# Patient Record
Sex: Female | Born: 1965 | Race: White | Hispanic: No | Marital: Married | Smoking: Current every day smoker
Health system: Southern US, Community
[De-identification: ages and names within clinical notes are randomized; demographics above are authoritative.]

## PROBLEM LIST (undated history)

## (undated) DIAGNOSIS — I10 Essential (primary) hypertension: Secondary | ICD-10-CM

## (undated) DIAGNOSIS — F32A Depression, unspecified: Secondary | ICD-10-CM

## (undated) DIAGNOSIS — E119 Type 2 diabetes mellitus without complications: Secondary | ICD-10-CM

## (undated) DIAGNOSIS — F419 Anxiety disorder, unspecified: Secondary | ICD-10-CM

## (undated) HISTORY — PX: NO PAST SURGERIES: SHX2092

---

## 2004-07-31 ENCOUNTER — Emergency Department: Payer: Self-pay | Admitting: Unknown Physician Specialty

## 2004-08-05 ENCOUNTER — Emergency Department: Payer: Self-pay | Admitting: Emergency Medicine

## 2005-02-16 ENCOUNTER — Emergency Department: Payer: Self-pay | Admitting: Emergency Medicine

## 2005-11-17 ENCOUNTER — Emergency Department: Payer: Self-pay | Admitting: Unknown Physician Specialty

## 2006-02-08 ENCOUNTER — Emergency Department: Payer: Self-pay | Admitting: Internal Medicine

## 2006-11-11 ENCOUNTER — Emergency Department: Payer: Self-pay | Admitting: Emergency Medicine

## 2008-07-06 ENCOUNTER — Emergency Department: Payer: Self-pay | Admitting: Emergency Medicine

## 2009-07-26 DIAGNOSIS — I10 Essential (primary) hypertension: Secondary | ICD-10-CM | POA: Insufficient documentation

## 2012-03-24 ENCOUNTER — Ambulatory Visit: Payer: Self-pay | Admitting: Family Medicine

## 2013-12-27 ENCOUNTER — Emergency Department: Payer: Self-pay | Admitting: Emergency Medicine

## 2013-12-27 LAB — CBC
HCT: 43.4 % (ref 35.0–47.0)
HGB: 14.5 g/dL (ref 12.0–16.0)
MCH: 31.2 pg (ref 26.0–34.0)
MCHC: 33.4 g/dL (ref 32.0–36.0)
MCV: 93 fL (ref 80–100)
Platelet: 315 10*3/uL (ref 150–440)
RBC: 4.64 10*6/uL (ref 3.80–5.20)
RDW: 13.3 % (ref 11.5–14.5)
WBC: 12.1 10*3/uL — AB (ref 3.6–11.0)

## 2013-12-27 LAB — BASIC METABOLIC PANEL
Anion Gap: 7 (ref 7–16)
BUN: 6 mg/dL — ABNORMAL LOW (ref 7–18)
CREATININE: 0.61 mg/dL (ref 0.60–1.30)
Calcium, Total: 8.5 mg/dL (ref 8.5–10.1)
Chloride: 111 mmol/L — ABNORMAL HIGH (ref 98–107)
Co2: 23 mmol/L (ref 21–32)
EGFR (Non-African Amer.): >60
GLUCOSE: 97 mg/dL (ref 65–99)
Osmolality: 279 (ref 275–301)
Potassium: 3.5 mmol/L (ref 3.5–5.1)
SODIUM: 141 mmol/L (ref 136–145)

## 2013-12-27 LAB — TROPONIN I: Troponin-I: <0.02 ng/mL

## 2014-02-01 ENCOUNTER — Ambulatory Visit: Payer: Self-pay | Admitting: Cardiovascular Disease

## 2014-08-07 ENCOUNTER — Encounter: Payer: Self-pay | Admitting: Emergency Medicine

## 2014-08-07 ENCOUNTER — Emergency Department
Admission: EM | Admit: 2014-08-07 | Discharge: 2014-08-07 | Disposition: A | Discharge status: Home / Self Care | Payer: BLUE CROSS/BLUE SHIELD | Attending: Student | Admitting: Student

## 2014-08-07 DIAGNOSIS — F419 Anxiety disorder, unspecified: Secondary | ICD-10-CM | POA: Diagnosis not present

## 2014-08-07 DIAGNOSIS — Y998 Other external cause status: Secondary | ICD-10-CM | POA: Insufficient documentation

## 2014-08-07 DIAGNOSIS — Z720 Tobacco use: Secondary | ICD-10-CM | POA: Insufficient documentation

## 2014-08-07 DIAGNOSIS — M549 Dorsalgia, unspecified: Secondary | ICD-10-CM | POA: Diagnosis present

## 2014-08-07 DIAGNOSIS — Y9289 Other specified places as the place of occurrence of the external cause: Secondary | ICD-10-CM | POA: Diagnosis not present

## 2014-08-07 DIAGNOSIS — S39012A Strain of muscle, fascia and tendon of lower back, initial encounter: Secondary | ICD-10-CM | POA: Diagnosis not present

## 2014-08-07 DIAGNOSIS — Y9389 Activity, other specified: Secondary | ICD-10-CM | POA: Insufficient documentation

## 2014-08-07 DIAGNOSIS — X58XXXA Exposure to other specified factors, initial encounter: Secondary | ICD-10-CM | POA: Diagnosis not present

## 2014-08-07 HISTORY — DX: Anxiety disorder, unspecified: F41.9

## 2014-08-07 LAB — URINALYSIS COMPLETE WITH MICROSCOPIC (ARMC ONLY)
BACTERIA UA: NONE SEEN
BILIRUBIN URINE: NEGATIVE
Glucose, UA: NEGATIVE mg/dL
Hgb urine dipstick: NEGATIVE
KETONES UR: NEGATIVE mg/dL
Leukocytes, UA: NEGATIVE
Nitrite: NEGATIVE
PROTEIN: NEGATIVE mg/dL
RBC / HPF: NONE SEEN RBC/hpf (ref 0–5)
SPECIFIC GRAVITY, URINE: 1.001 — AB (ref 1.005–1.030)
WBC, UA: NONE SEEN WBC/hpf (ref 0–5)
pH: 7 (ref 5.0–8.0)

## 2014-08-07 MED ORDER — NAPROXEN 500 MG PO TABS
500.0000 mg | ORAL_TABLET | Freq: Once | ORAL | Status: AC
  Administered 2014-08-07: 500 mg via ORAL

## 2014-08-07 MED ORDER — NAPROXEN 500 MG PO TABS: 500.0000 mg | ORAL_TABLET | Freq: Two times a day (BID) | ORAL | Status: AC

## 2014-08-07 NOTE — Discharge Instructions (Signed)
Back Pain, Adult Low back pain is very common. About 1 in 5 people have back pain.The cause of low back pain is rarely dangerous. The pain often gets better over time.About half of people with a sudden onset of back pain feel better in just 2 weeks. About 8 in 10 people feel better by 6 weeks.  CAUSES Some common causes of back pain include:  Strain of the muscles or ligaments supporting the spine.  Wear and tear (degeneration) of the spinal discs.  Arthritis.  Direct injury to the back. DIAGNOSIS Most of the time, the direct cause of low back pain is not known.However, back pain can be treated effectively even when the exact cause of the pain is unknown.Answering your caregiver's questions about your overall health and symptoms is one of the most accurate ways to make sure the cause of your pain is not dangerous. If your caregiver needs more information, he or she may order lab work or imaging tests (X-rays or MRIs).However, even if imaging tests show changes in your back, this usually does not require surgery. HOME CARE INSTRUCTIONS For many people, back pain returns.Since low back pain is rarely dangerous, it is often a condition that people can learn to [facility] their own.   Remain active. It is stressful on the back to sit or stand in one place. Do not sit, drive, or stand in one place for more than 30 minutes at a time. Take short walks on level surfaces as soon as pain allows.Try to increase the length of time you walk each day.  Do not stay in bed.Resting more than 1 or 2 days can delay your recovery.  Do not avoid exercise or work.Your body is made to move.It is not dangerous to be active, even though your back may hurt.Your back will likely heal faster if you return to being active before your pain is gone.  Pay attention to your body when you bend and lift. Many people have less discomfortwhen lifting if they bend their knees, keep the load close to their bodies,and  avoid twisting. Often, the most comfortable positions are those that put less stress on your recovering back.  Find a comfortable position to sleep. Use a firm mattress and lie on your side with your knees slightly bent. If you lie on your back, put a pillow under your knees.  Only take over-the-counter or prescription medicines as directed by your caregiver. Over-the-counter medicines to reduce pain and inflammation are often the most helpful.Your caregiver may prescribe muscle relaxant drugs.These medicines help dull your pain so you can more quickly return to your normal activities and healthy exercise.  Put ice on the injured area.  Put ice in a plastic bag.  Place a towel between your skin and the bag.  Leave the ice on for 15-20 minutes, 03-04 times a day for the first 2 to 3 days. After that, ice and heat may be alternated to reduce pain and spasms.  Ask your caregiver about trying back exercises and gentle massage. This may be of some benefit.  Avoid feeling anxious or stressed.Stress increases muscle tension and can worsen back pain.It is important to recognize when you are anxious or stressed and learn ways to manage it.Exercise is a great option. SEEK MEDICAL CARE IF:  You have pain that is not relieved with rest or medicine.  You have pain that does not improve in 1 week.  You have new symptoms.  You are generally not feeling well. SEEK  IMMEDIATE MEDICAL CARE IF:  °· You have pain that radiates from your back into your legs. °· You develop new bowel or bladder control problems. °· You have unusual weakness or numbness in your arms or legs. °· You develop nausea or vomiting. °· You develop abdominal pain. °· You feel faint. °Document Released: 03/25/2005 Document Revised: 09/24/2011 Document Reviewed: 07/27/2013 °[employer]® Patient Information ©2015 [employer]. This information is not intended to replace advice given to you by your health care provider. Make sure you  discuss any questions you have with your health care provider. ° °Back Exercises °Back exercises help treat and prevent back injuries. The goal is to increase your strength in your belly (abdominal) and back muscles. These exercises can also help with flexibility. Start these exercises when told by your doctor. °HOME CARE °Back exercises include: °Pelvic Tilt. °· Lie on your back with your knees bent. Tilt your pelvis until the lower part of your back is against the floor. Hold this position 5 to 10 sec. Repeat this exercise 5 to 10 times. °Knee to Chest. °· Pull 1 knee up against your chest and hold for 20 to 30 seconds. Repeat this with the other knee. This may be done with the other leg straight or bent, whichever feels better. Then, pull both knees up against your chest. °Sit-Ups or Curl-Ups. °· Bend your knees 90 degrees. Start with tilting your pelvis, and do a partial, slow sit-up. Only lift your upper half 30 to 45 degrees off the floor. Take at least 2 to 3 seonds for each sit-up. Do not do sit-ups with your knees out straight. If partial sit-ups are difficult, simply do the above but with only tightening your belly (abdominal) muscles and holding it as told. °Hip-Lift. °· Lie on your back with your knees flexed 90 degrees. Push down with your feet and shoulders as you raise your hips 2 inches off the floor. Hold for 10 seconds, repeat 5 to 10 times. °Back Arches. °· Lie on your stomach. Prop yourself up on bent elbows. Slowly press on your hands, causing an arch in your low back. Repeat 3 to 5 times. °Shoulder-Lifts. °· Lie face down with arms beside your body. Keep hips and belly pressed to floor as you slowly lift your head and shoulders off the floor. °Do not overdo your exercises. Be careful in the beginning. Exercises may cause you some mild back discomfort. If the pain lasts for more than 15 minutes, stop the exercises until you see your doctor. Improvement with exercise for back problems is slow.    °Document Released: 04/27/2010 Document Revised: 06/17/2011 Document Reviewed: 01/24/2011 °[employer]® Patient Information ©2015 [employer]. This information is not intended to replace advice given to you by your health care provider. Make sure you discuss any questions you have with your health care provider. ° °

## 2014-08-07 NOTE — ED Notes (Signed)
Patient c/o left flank pain. Admits to urinary frequency. Denies any dysuria, hematuria. Denies any knowledge of any precipitating injury. Able to ambulate to triage. No obvious distress.

## 2014-08-07 NOTE — ED Provider Notes (Addendum)
[facility] Emergency Department Provider Note    ____________________________________________  Time seen: ----------------------------------------- 5:09 PM on 08/07/2014 -----------------------------------------    I have reviewed the triage vital signs and the nursing notes.   HISTORY  Chief Complaint Back Pain       HPI Linda Horn is a 49 y.o. female with history of anxiety who presents with one-day atraumatic left leg pain. Gradual onset. Currently maximal severity. She reports cramping pain on the left mid back. Pain is worse with movement, rotation. No recent surgery, no new meds, no injury, no falls, no trauma. Staying still makes it better. He denies any history of malignancy, bowel or bladder incontinence, numbness or weakness, fevers or history of IV drug use. She has had increased urination, but no dysuria, no hematuria, no history of kidney stones.     Past Medical History  Diagnosis Date  . Anxiety     There are no active problems to display for this patient.   History reviewed. No pertinent past surgical history.  Current Outpatient Rx  Name  Route  Sig  Dispense  Refill  . naproxen (NAPROSYN) 500 MG tablet   Oral   Take 1 tablet (500 mg total) by mouth 2 (two) times daily with a meal.   10 tablet   0     Allergies Review of patient's allergies indicates no known allergies.  No family history on file.  Social History History  Substance Use Topics  . Smoking status: Current Every Day Smoker -- 1.00 packs/day for 25 years    Types: Cigarettes  . Smokeless tobacco: Not on file  . Alcohol Use: No    Review of Systems  Constitutional: Negative for fever. Eyes: Negative for visual changes. ENT: Negative for sore throat. Cardiovascular: Negative for chest pain. Respiratory: Negative for shortness of breath. Gastrointestinal: Negative for abdominal pain, vomiting and diarrhea. Genitourinary: Negative for  dysuria. Musculoskeletal: Positive for back pain. Skin: Negative for rash. Neurological: Negative for headaches, focal weakness or numbness.   10-point ROS otherwise negative.  ____________________________________________   PHYSICAL EXAM:  VITAL SIGNS: ED Triage Vitals  Enc Vitals Group     BP 08/07/14 1502 148/68 mmHg     Pulse Rate 08/07/14 1502 67     Resp 08/07/14 1502 16     Temp 08/07/14 1502 98.2 F (36.8 C)     Temp Source 08/07/14 1502 Oral     SpO2 08/07/14 1502 97 %     Weight 08/07/14 1502 164 lb (74.39 kg)     Height 08/07/14 1502 5\' 1"  (1.549 m)     Head Cir --      Peak Flow --      Pain Score 08/07/14 1504 10     Pain Loc --      Pain Edu? --      Excl. in [place]? --      Constitutional: Alert and oriented. Well appearing and in no distress. Eyes: Conjunctivae are normal. PERRL. Normal extraocular movements. ENT   Head: Normocephalic and atraumatic.   Nose: No congestion/rhinnorhea.   Mouth/Throat: Mucous membranes are moist.   Neck: No stridor. Hematological/Lymphatic/Immunilogical: No cervical lymphadenopathy. Cardiovascular: Normal rate, regular rhythm. Normal and symmetric distal pulses are present in all extremities. No murmurs, rubs, or gallops. Respiratory: Normal respiratory effort without tachypnea nor retractions. Breath sounds are clear and equal bilaterally. No wheezes/rales/rhonchi. Gastrointestinal: Soft and nontender. No distention. No abdominal bruits. There is no CVA tenderness. Genitourinary: deferred Musculoskeletal: Nontender  with normal range of motion in all extremities. No joint effusions.  No lower extremity tenderness nor edema. Back: Moderate tenderness of the left paravertebral muscles of the mid T-spine no midline tenderness. No bony step-off or deformity. Neurologic:  Normal speech and language. No gross focal neurologic deficits are appreciated. Speech is normal. No gait instability. Normal dorsiflexion of bilateral  big toes. Primary 5 strength in bilateral lower extremities Skin:  Skin is warm, dry and intact. No rash noted. Psychiatric: Mood and affect are normal. Speech and behavior are normal. Patient exhibits appropriate insight and judgment.  ____________________________________________    ____________________________________________    RADIOLOGY  None ____________________________________________   PROCEDURES   Procedure(s) performed: None  Critical Care performed: No  ____________________________________________   INITIAL IMPRESSION / ASSESSMENT AND PLAN / ED COURSE  Pertinent labs & imaging results that were available during my care of the patient were reviewed by me and considered in my medical decision making (see chart for details).  49 year old female presents with 1 day of atraumatic left back pain, likely muscular skeletal in nature, reproducible on exam. Advised concerning for cauda equina or epidural abscess. Intact neurological examination. No indication for imaging in the absence of trauma or bony tenderness. Discussed symptomatic care, treatment with anti-inflammatory medications, PCP follow-up. Patient is in agreement with discharge plan. UA negative. No  Pregnancy test that she is post menopausal ____________________________________________   FINAL CLINICAL IMPRESSION(S) / ED DIAGNOSES  Final diagnoses:  Back strain, initial encounter     Joanne Gavel, MD 08/07/14 [place]  Joanne Gavel, MD 08/07/14 1719

## 2015-08-18 ENCOUNTER — Emergency Department: Payer: BLUE CROSS/BLUE SHIELD

## 2015-08-18 ENCOUNTER — Encounter: Payer: Self-pay | Admitting: Medical Oncology

## 2015-08-18 ENCOUNTER — Emergency Department
Admission: EM | Admit: 2015-08-18 | Discharge: 2015-08-18 | Disposition: A | Discharge status: Home / Self Care | Payer: BLUE CROSS/BLUE SHIELD | Attending: Emergency Medicine | Admitting: Emergency Medicine

## 2015-08-18 DIAGNOSIS — I10 Essential (primary) hypertension: Secondary | ICD-10-CM | POA: Insufficient documentation

## 2015-08-18 DIAGNOSIS — Z79899 Other long term (current) drug therapy: Secondary | ICD-10-CM | POA: Diagnosis not present

## 2015-08-18 DIAGNOSIS — M545 Low back pain, unspecified: Secondary | ICD-10-CM

## 2015-08-18 DIAGNOSIS — R739 Hyperglycemia, unspecified: Secondary | ICD-10-CM | POA: Diagnosis not present

## 2015-08-18 DIAGNOSIS — F17210 Nicotine dependence, cigarettes, uncomplicated: Secondary | ICD-10-CM | POA: Diagnosis not present

## 2015-08-18 HISTORY — DX: Essential (primary) hypertension: I10

## 2015-08-18 LAB — BASIC METABOLIC PANEL
Anion gap: 5 (ref 5–15)
BUN: 10 mg/dL (ref 6–20)
CALCIUM: 9.1 mg/dL (ref 8.9–10.3)
CO2: 27 mmol/L (ref 22–32)
CREATININE: 0.53 mg/dL (ref 0.44–1.00)
Chloride: 108 mmol/L (ref 101–111)
Glucose, Bld: 148 mg/dL — ABNORMAL HIGH (ref 65–99)
POTASSIUM: 3.8 mmol/L (ref 3.5–5.1)
Sodium: 140 mmol/L (ref 135–145)

## 2015-08-18 LAB — CBC WITH DIFFERENTIAL/PLATELET
BASOS ABS: 0.1 10*3/uL (ref 0–0.1)
EOS ABS: 0.2 10*3/uL (ref 0–0.7)
HCT: 42.6 % (ref 35.0–47.0)
Hemoglobin: 14.4 g/dL (ref 12.0–16.0)
Lymphocytes Relative: 32 %
Lymphs Abs: 3.6 10*3/uL (ref 1.0–3.6)
MCH: 30.9 pg (ref 26.0–34.0)
MCHC: 33.8 g/dL (ref 32.0–36.0)
MCV: 91.2 fL (ref 80.0–100.0)
MONO ABS: 0.7 10*3/uL (ref 0.2–0.9)
Monocytes Relative: 6 %
Neutro Abs: 6.8 10*3/uL — ABNORMAL HIGH (ref 1.4–6.5)
Neutrophils Relative %: 59 %
PLATELETS: 287 10*3/uL (ref 150–440)
RBC: 4.67 MIL/uL (ref 3.80–5.20)
RDW: 13.6 % (ref 11.5–14.5)
WBC: 11.5 10*3/uL — AB (ref 3.6–11.0)

## 2015-08-18 LAB — URINALYSIS COMPLETE WITH MICROSCOPIC (ARMC ONLY)
BILIRUBIN URINE: NEGATIVE
KETONES UR: NEGATIVE mg/dL
Leukocytes, UA: NEGATIVE
NITRITE: NEGATIVE
Protein, ur: NEGATIVE mg/dL
Specific Gravity, Urine: 1.022 (ref 1.005–1.030)
pH: 5 (ref 5.0–8.0)

## 2015-08-18 LAB — PREGNANCY, URINE: Preg Test, Ur: NEGATIVE

## 2015-08-18 LAB — HEMOGLOBIN A1C: HEMOGLOBIN A1C: 7.7 % — AB (ref 4.0–6.0)

## 2015-08-18 MED ORDER — KETOROLAC TROMETHAMINE 30 MG/ML IJ SOLN
30.0000 mg | Freq: Once | INTRAMUSCULAR | Status: AC
  Administered 2015-08-18: 30 mg via INTRAMUSCULAR
  Filled 2015-08-18: qty 1

## 2015-08-18 MED ORDER — CYCLOBENZAPRINE HCL 5 MG PO TABS: 5.0000 mg | ORAL_TABLET | Freq: Three times a day (TID) | ORAL | Status: DC | PRN

## 2015-08-18 MED ORDER — NAPROXEN 500 MG PO TABS: 500.0000 mg | ORAL_TABLET | Freq: Two times a day (BID) | ORAL | Status: DC

## 2015-08-18 NOTE — Discharge Instructions (Signed)
Back Exercises The following exercises strengthen the muscles that help to support the back. They also help to keep the lower back flexible. Doing these exercises can help to prevent back pain or lessen existing pain. If you have back pain or discomfort, try doing these exercises 2-3 times each day or as told by your health care provider. When the pain goes away, do them once each day, but increase the number of times that you repeat the steps for each exercise (do more repetitions). If you do not have back pain or discomfort, do these exercises once each day or as told by your health care provider. EXERCISES Single Knee to Chest Repeat these steps 3-5 times for each leg:  Lie on your back on a firm bed or the floor with your legs extended.  Bring one knee to your chest. Your other leg should stay extended and in contact with the floor.  Hold your knee in place by grabbing your knee or thigh.  Pull on your knee until you feel a gentle stretch in your lower back.  Hold the stretch for 10-30 seconds.  Slowly release and straighten your leg. Pelvic Tilt Repeat these steps 5-10 times:  Lie on your back on a firm bed or the floor with your legs extended.  Bend your knees so they are pointing toward the ceiling and your feet are flat on the floor.  Tighten your lower abdominal muscles to press your lower back against the floor. This motion will tilt your pelvis so your tailbone points up toward the ceiling instead of pointing to your feet or the floor.  With gentle tension and even breathing, hold this position for 5-10 seconds. Cat-Cow Repeat these steps until your lower back becomes more flexible:  Get into a hands-and-knees position on a firm surface. Keep your hands under your shoulders, and keep your knees under your hips. You may place padding under your knees for comfort.  Let your head hang down, and point your tailbone toward the floor so your lower back becomes rounded like the  back of a cat.  Hold this position for 5 seconds.  Slowly lift your head and point your tailbone up toward the ceiling so your back forms a sagging arch like the back of a cow.  Hold this position for 5 seconds. Press-Ups Repeat these steps 5-10 times:  Lie on your abdomen (face-down) on the floor.  Place your palms near your head, about shoulder-width apart.  While you keep your back as relaxed as possible and keep your hips on the floor, slowly straighten your arms to raise the top half of your body and lift your shoulders. Do not use your back muscles to raise your upper torso. You may adjust the placement of your hands to make yourself more comfortable.  Hold this position for 5 seconds while you keep your back relaxed.  Slowly return to lying flat on the floor. Bridges Repeat these steps 10 times:  Lie on your back on a firm surface.  Bend your knees so they are pointing toward the ceiling and your feet are flat on the floor.  Tighten your buttocks muscles and lift your buttocks off of the floor until your waist is at almost the same height as your knees. You should feel the muscles working in your buttocks and the back of your thighs. If you do not feel these muscles, slide your feet 1-2 inches farther away from your buttocks.  Hold this position for 3-5  seconds.  Slowly lower your hips to the starting position, and allow your buttocks muscles to relax completely. If this exercise is too easy, try doing it with your arms crossed over your chest. Abdominal Crunches Repeat these steps 5-10 times:  Lie on your back on a firm bed or the floor with your legs extended.  Bend your knees so they are pointing toward the ceiling and your feet are flat on the floor.  Cross your arms over your chest.  Tip your chin slightly toward your chest without bending your neck.  Tighten your abdominal muscles and slowly raise your trunk (torso) high enough to lift your shoulder blades a  tiny bit off of the floor. Avoid raising your torso higher than that, because it can put too much stress on your low back and it does not help to strengthen your abdominal muscles.  Slowly return to your starting position. Back Lifts Repeat these steps 5-10 times:  Lie on your abdomen (face-down) with your arms at your sides, and rest your forehead on the floor.  Tighten the muscles in your legs and your buttocks.  Slowly lift your chest off of the floor while you keep your hips pressed to the floor. Keep the back of your head in line with the curve in your back. Your eyes should be looking at the floor.  Hold this position for 3-5 seconds.  Slowly return to your starting position. SEEK MEDICAL CARE IF:  Your back pain or discomfort gets much worse when you do an exercise.  Your back pain or discomfort does not lessen within 2 hours after you exercise. If you have any of these problems, stop doing these exercises right away. Do not do them again unless your health care provider says that you can. SEEK IMMEDIATE MEDICAL CARE IF:  You develop sudden, severe back pain. If this happens, stop doing the exercises right away. Do not do them again unless your health care provider says that you can.   This information is not intended to replace advice given to you by your health care provider. Make sure you discuss any questions you have with your health care provider.   Document Released: 05/02/2004 Document Revised: 12/14/2014 Document Reviewed: 05/19/2014 Elsevier Interactive Patient Education 2016 [employer]  Back Pain, Adult Back pain is very common in adults.The cause of back pain is rarely dangerous and the pain often gets better over time.The cause of your back pain may not be known. Some common causes of back pain include:  Strain of the muscles or ligaments supporting the spine.  Wear and tear (degeneration) of the spinal disks.  Arthritis.  Direct injury to the  back. For many people, back pain may return. Since back pain is rarely dangerous, most people can learn to manage this condition on their own. HOME CARE INSTRUCTIONS Watch your back pain for any changes. The following actions may help to lessen any discomfort you are feeling:  Remain active. It is stressful on your back to sit or stand in one place for long periods of time. Do not sit, drive, or stand in one place for more than 30 minutes at a time. Take short walks on even surfaces as soon as you are able.Try to increase the length of time you walk each day.  Exercise regularly as directed by your health care provider. Exercise helps your back heal faster. It also helps avoid future injury by keeping your muscles strong and flexible.  Do not stay in  bed.Resting more than 1-2 days can delay your recovery.  Pay attention to your body when you bend and lift. The most comfortable positions are those that put less stress on your recovering back. Always use proper lifting techniques, including:  Bending your knees.  Keeping the load close to your body.  Avoiding twisting.  Find a comfortable position to sleep. Use a firm mattress and lie on your side with your knees slightly bent. If you lie on your back, put a pillow under your knees.  Avoid feeling anxious or stressed.Stress increases muscle tension and can worsen back pain.It is important to recognize when you are anxious or stressed and learn ways to manage it, such as with exercise.  Take medicines only as directed by your health care provider. Over-the-counter medicines to reduce pain and inflammation are often the most helpful.Your health care provider may prescribe muscle relaxant drugs.These medicines help dull your pain so you can more quickly return to your normal activities and healthy exercise.  Apply ice to the injured area:  Put ice in a plastic bag.  Place a towel between your skin and the bag.  Leave the ice on for 20  minutes, 2-3 times a day for the first 2-3 days. After that, ice and heat may be alternated to reduce pain and spasms.  Maintain a healthy weight. Excess weight puts extra stress on your back and makes it difficult to maintain good posture. SEEK MEDICAL CARE IF:  You have pain that is not relieved with rest or medicine.  You have increasing pain going down into the legs or buttocks.  You have pain that does not improve in one week.  You have night pain.  You lose weight.  You have a fever or chills. SEEK IMMEDIATE MEDICAL CARE IF:   You develop new bowel or bladder control problems.  You have unusual weakness or numbness in your arms or legs.  You develop nausea or vomiting.  You develop abdominal pain.  You feel faint.   This information is not intended to replace advice given to you by your health care provider. Make sure you discuss any questions you have with your health care provider.   Document Released: 03/25/2005 Document Revised: 04/15/2014 Document Reviewed: 07/27/2013 Elsevier Interactive Patient Education 2016 [place].  Hyperglycemia Hyperglycemia occurs when the glucose (sugar) in your blood is too high. Hyperglycemia can happen for many reasons, but it most often happens to people who do not know they have diabetes or are not managing their diabetes properly.  CAUSES  Whether you have diabetes or not, there are other causes of hyperglycemia. Hyperglycemia can occur when you have diabetes, but it can also occur in other situations that you might not be as aware of, such as: Diabetes  If you have diabetes and are having problems controlling your blood glucose, hyperglycemia could occur because of some of the following reasons:  Not following your meal plan.  Not taking your diabetes medications or not taking it properly.  Exercising less or doing less activity than you normally do.  Being sick. Pre-diabetes  This cannot be ignored. Before people  develop Type 2 diabetes, they almost always have "pre-diabetes." This is when your blood glucose levels are higher than normal, but not yet high enough to be diagnosed as diabetes. Research has shown that some long-term damage to the body, especially the heart and circulatory system, may already be occurring during pre-diabetes. If you take action to manage your blood glucose when  you have pre-diabetes, you may delay or prevent Type 2 diabetes from developing. Stress  If you have diabetes, you may be "diet" controlled or on oral medications or insulin to control your diabetes. However, you may find that your blood glucose is higher than usual in the hospital whether you have diabetes or not. This is often referred to as "stress hyperglycemia." Stress can elevate your blood glucose. This happens because of hormones put out by the body during times of stress. If stress has been the cause of your high blood glucose, it can be followed regularly by your caregiver. That way he/she can make sure your hyperglycemia does not continue to get worse or progress to diabetes. Steroids  Steroids are medications that act on the infection fighting system (immune system) to block inflammation or infection. One side effect can be a rise in blood glucose. Most people can produce enough extra insulin to allow for this rise, but for those who cannot, steroids make blood glucose levels go even higher. It is not unusual for steroid treatments to "uncover" diabetes that is developing. It is not always possible to determine if the hyperglycemia will go away after the steroids are stopped. A special blood test called an A1c is sometimes done to determine if your blood glucose was elevated before the steroids were started. SYMPTOMS  Thirsty.  Frequent urination.  Dry mouth.  Blurred vision.  Tired or fatigue.  Weakness.  Sleepy.  Tingling in feet or leg. DIAGNOSIS  Diagnosis is made by monitoring blood glucose in one  or all of the following ways:  A1c test. This is a chemical found in your blood.  Fingerstick blood glucose monitoring.  Laboratory results. TREATMENT  First, knowing the cause of the hyperglycemia is important before the hyperglycemia can be treated. Treatment may include, but is not be limited to:  Education.  Change or adjustment in medications.  Change or adjustment in meal plan.  Treatment for an illness, infection, etc.  More frequent blood glucose monitoring.  Change in exercise plan.  Decreasing or stopping steroids.  Lifestyle changes. HOME CARE INSTRUCTIONS   Test your blood glucose as directed.  Exercise regularly. Your caregiver will give you instructions about exercise. Pre-diabetes or diabetes which comes on with stress is helped by exercising.  Eat wholesome, balanced meals. Eat often and at regular, fixed times. Your caregiver or nutritionist will give you a meal plan to guide your sugar intake.  Being at an ideal weight is important. If needed, losing as little as 10 to 15 pounds may help improve blood glucose levels. SEEK MEDICAL CARE IF:   You have questions about medicine, activity, or diet.  You continue to have symptoms (problems such as increased thirst, urination, or weight gain). SEEK IMMEDIATE MEDICAL CARE IF:   You are vomiting or have diarrhea.  Your breath smells fruity.  You are breathing faster or slower.  You are very sleepy or incoherent.  You have numbness, tingling, or pain in your feet or hands.  You have chest pain.  Your symptoms get worse even though you have been following your caregiver's orders.  If you have any other questions or concerns.   This information is not intended to replace advice given to you by your health care provider. Make sure you discuss any questions you have with your health care provider.   Document Released: 09/18/2000 Document Revised: 06/17/2011 Document Reviewed: 11/29/2014 Elsevier  Interactive Patient Education [employer].

## 2015-08-18 NOTE — ED Notes (Signed)
See triage note  States she developed some lower back pain yesterday unsure of injury  Denies any urinary sx's  Ambulates slowly d/t pain

## 2015-08-18 NOTE — ED Provider Notes (Signed)
[facility] Emergency Department Provider Note  ____________________________________________  Time seen: Approximately 8:48 AM  I have reviewed the triage vital signs and the nursing notes.   HISTORY  Chief Complaint Back Pain    HPI Linda Horn is a 50 y.o. female , NAD, presents to the emergency Department with 24-hour history of right lower back pain. States the pain is in the right lower back only. No radiation of pain.The pain began while she was working. Does note that she lifted a crate of eggs prior to the pain starting but is uncertain if the lifting was related to her onset of pain. Denies any numbness, weakness, tingling. No loss of bowel or bladder control. No saddle paresthesias. Denies any injuries, traumas, falls to incite the back pain. Has taken 2 tablets of over-the-counter ibuprofen with no resolution of pain. Denies any dysuria, hematuria, abdominal pain, nausea, vomiting. Has not noted any chest pain nor shortness of breath.   Past Medical History  Diagnosis Date  . Anxiety   . Hypertension     There are no active problems to display for this patient.   History reviewed. No pertinent past surgical history.  Current Outpatient Rx  Name  Route  Sig  Dispense  Refill  . escitalopram (LEXAPRO) 20 MG tablet   Oral   Take 20 mg by mouth daily.         . hydrochlorothiazide (HYDRODIURIL) 25 MG tablet   Oral   Take 25 mg by mouth daily.         . cyclobenzaprine (FLEXERIL) 5 MG tablet   Oral   Take 1 tablet (5 mg total) by mouth every 8 (eight) hours as needed for muscle spasms.   21 tablet   0   . naproxen (NAPROSYN) 500 MG tablet   Oral   Take 1 tablet (500 mg total) by mouth 2 (two) times daily with a meal.   14 tablet   0     Allergies Review of patient's allergies indicates no known allergies.  No family history on file.  Social History Social History  Substance Use Topics  . Smoking status: Current  Every Day Smoker -- 1.00 packs/day for 25 years    Types: Cigarettes  . Smokeless tobacco: None  . Alcohol Use: No     Review of Systems  Constitutional: No fever/chills Cardiovascular: No chest pain. Respiratory:  No shortness of breath.  Gastrointestinal: No abdominal pain.  No nausea, vomiting.   Genitourinary: Negative for dysuria, hematuria. No urinary hesitancy, urgency or increased frequency. Musculoskeletal: Positive for right lower back pain. Negative for neck pain.  Skin: Negative for rash, skin sores, bruising. Neurological: Negative for headaches, focal weakness or numbness. No tingling. No saddle paresthesias. No loss of bowel or bladder control. 10-point ROS otherwise negative.  ____________________________________________   PHYSICAL EXAM:  VITAL SIGNS: ED Triage Vitals  Enc Vitals Group     BP 08/18/15 0831 144/65 mmHg     Pulse Rate 08/18/15 0831 65     Resp 08/18/15 0831 17     Temp 08/18/15 0831 97.7 F (36.5 C)     Temp Source 08/18/15 0831 Oral     SpO2 08/18/15 0831 97 %     Weight 08/18/15 0831 165 lb (74.844 kg)     Height 08/18/15 0831 5\' 1"  (1.549 m)     Head Cir --      Peak Flow --      Pain Score 08/18/15 0832  10     Pain Loc --      Pain Edu? --      Excl. in [place]? --      Constitutional: Alert and oriented. Well appearing and in no acute distress. Eyes: Conjunctivae are normal.  Head: Atraumatic. Neck: No cervical spine tenderness to palpation. Supple with full range of motion. Respiratory: Normal respiratory effort without tachypnea or retractions.  Gastrointestinal:  No CVA tenderness. Musculoskeletal: Full range of motion of the lumbar spine with mild discomfort with full flexion and left lateral flexion. No thoracic, lumbar, sacral spinal tenderness to palpation. No paraspinal tenderness about the lumbar spine. No palpation of muscle spasms noted.   Neurologic:  Normal speech and language. No gross focal neurologic deficits are  appreciated.  Skin:  Skin is warm, dry and intact. No rash, skin sores, redness noted. Psychiatric: Mood and affect are normal. Speech and behavior are normal. Patient exhibits appropriate insight and judgement.   ____________________________________________   LABS (all labs ordered are listed, but only abnormal results are displayed)  Labs Reviewed  URINALYSIS COMPLETEWITH MICROSCOPIC ([place] ONLY) - Abnormal; Notable for the following:    Color, Urine YELLOW (*)    APPearance HAZY (*)    Glucose, UA >500 (*)    Hgb urine dipstick 1+ (*)    Bacteria, UA RARE (*)    Squamous Epithelial / LPF 6-30 (*)    All other components within normal limits  BASIC METABOLIC PANEL - Abnormal; Notable for the following:    Glucose, Bld 148 (*)    All other components within normal limits  CBC WITH DIFFERENTIAL/PLATELET - Abnormal; Notable for the following:    WBC 11.5 (*)    Neutro Abs 6.8 (*)    All other components within normal limits  URINE CULTURE  PREGNANCY, URINE  HEMOGLOBIN A1C   ____________________________________________  EKG  None ____________________________________________  RADIOLOGY I have personally viewed and evaluated these images (plain radiographs) as part of my medical decision making, as well as reviewing the written report by the radiologist.  Ct Renal Stone Study  08/18/2015  CLINICAL DATA:  Right low back pain since yesterday. No known injury. Initial encounter. EXAM: CT ABDOMEN AND PELVIS WITHOUT CONTRAST TECHNIQUE: Multidetector CT imaging of the abdomen and pelvis was performed following the standard protocol without IV contrast. COMPARISON:  None. FINDINGS: The lung bases demonstrate mild dependent atelectasis. No pleural or pericardial effusion. No renal or ureteral stones are identified on the right or left. The kidneys, ureters and urinary bladder are unremarkable. Uterus and adnexa appear normal. The liver is diffusely low attenuating consistent with fatty  infiltration. The liver measures approximately 21 cm craniocaudal. The gallbladder, adrenal glands, spleen, pancreas and biliary tree appear normal. Aortoiliac atherosclerosis without aneurysm is identified. The stomach, small and large bowel and appendix appear normal. No lymphadenopathy or fluid. No focal bony abnormality. Degenerative disc disease is seen at L5-S1. IMPRESSION: Negative for urinary tract stone or acute abnormality. Fatty infiltration of the liver and hepatomegaly. Aortoiliac atherosclerosis. L5-S1 degenerative disc disease. Electronically Signed   By: Inge Rise M.D.   On: 08/18/2015 10:38    ____________________________________________    PROCEDURES  Procedure(s) performed: None    Medications  ketorolac (TORADOL) 30 MG/ML injection 30 mg (30 mg Intramuscular Given 08/18/15 0924)   Patient has improvement of back pain since being given Toradol earlier this morning. She has been sleeping throughout part of her visit and notes feeling comfortable when she lays down.  ____________________________________________  INITIAL IMPRESSION / ASSESSMENT AND PLAN / ED COURSE  Pertinent labs & imaging results that were available during my care of the patient were reviewed by me and considered in my medical decision making (see chart for details).  All labs and imaging results that were available were discussed with the patient. She does have a significant family history for diabetes in which she has a sibling as well as her mother are diabetic and on glucose lowering medications. Patient does note she has had increased polydipsia and polyuria over the last few months. Has not had any abdominal pain, nausea, vomiting nor overt fatigue.   Patient's diagnosis is consistent with right-sided lower back pain without sciatica and incidental finding of hyperglycemia more than likely related to undiagnosed diabetes mellitus. Patient will be discharged home with prescriptions for  naproxen and Flexeril to take as directed. Patient is to follow up with her primary care provider on Monday for follow-up of hyperglycemia and subsequent A1c results that are currently pending.  Patient is given ED precautions to return to the ED for any worsening or new symptoms.    ____________________________________________  FINAL CLINICAL IMPRESSION(S) / ED DIAGNOSES  Final diagnoses:  Right-sided low back pain without sciatica  Hyperglycemia      NEW MEDICATIONS STARTED DURING THIS VISIT:  New Prescriptions   CYCLOBENZAPRINE (FLEXERIL) 5 MG TABLET    Take 1 tablet (5 mg total) by mouth every 8 (eight) hours as needed for muscle spasms.   NAPROXEN (NAPROSYN) 500 MG TABLET    Take 1 tablet (500 mg total) by mouth 2 (two) times daily with a meal.         Braxton Feathers, PA-C 08/18/15 1228  Daymon Larsen, MD 08/18/15 [phone]

## 2015-08-18 NOTE — ED Notes (Signed)
Pt reports that she began having rt lower back pain yesterday.

## 2015-08-19 ENCOUNTER — Other Ambulatory Visit: Payer: Self-pay | Admitting: Family Medicine

## 2015-08-19 DIAGNOSIS — I10 Essential (primary) hypertension: Secondary | ICD-10-CM

## 2015-08-20 LAB — URINE CULTURE

## 2015-08-26 ENCOUNTER — Other Ambulatory Visit: Payer: Self-pay | Admitting: Family Medicine

## 2015-08-26 DIAGNOSIS — F430 Acute stress reaction: Secondary | ICD-10-CM

## 2015-09-23 ENCOUNTER — Other Ambulatory Visit: Payer: Self-pay | Admitting: Family Medicine

## 2016-09-26 ENCOUNTER — Other Ambulatory Visit: Payer: Self-pay | Admitting: Nurse Practitioner

## 2016-09-26 DIAGNOSIS — R109 Unspecified abdominal pain: Secondary | ICD-10-CM

## 2016-10-07 ENCOUNTER — Ambulatory Visit: Payer: Self-pay

## 2017-01-01 ENCOUNTER — Other Ambulatory Visit: Payer: Self-pay | Admitting: Nurse Practitioner

## 2017-01-01 DIAGNOSIS — Z1231 Encounter for screening mammogram for malignant neoplasm of breast: Secondary | ICD-10-CM

## 2017-02-18 ENCOUNTER — Ambulatory Visit
Admission: RE | Admit: 2017-02-18 | Discharge: 2017-02-18 | Disposition: A | Discharge status: Home / Self Care | Payer: BLUE CROSS/BLUE SHIELD | Source: Ambulatory Visit | Attending: Nurse Practitioner | Admitting: Nurse Practitioner

## 2017-02-18 DIAGNOSIS — Z1231 Encounter for screening mammogram for malignant neoplasm of breast: Secondary | ICD-10-CM | POA: Diagnosis present

## 2017-03-09 ENCOUNTER — Other Ambulatory Visit: Payer: Self-pay

## 2017-03-09 ENCOUNTER — Encounter: Payer: Self-pay | Admitting: Emergency Medicine

## 2017-03-09 ENCOUNTER — Emergency Department
Admission: EM | Admit: 2017-03-09 | Discharge: 2017-03-09 | Disposition: A | Discharge status: Home / Self Care | Payer: BLUE CROSS/BLUE SHIELD | Attending: Emergency Medicine | Admitting: Emergency Medicine

## 2017-03-09 DIAGNOSIS — Z79899 Other long term (current) drug therapy: Secondary | ICD-10-CM | POA: Diagnosis not present

## 2017-03-09 DIAGNOSIS — I10 Essential (primary) hypertension: Secondary | ICD-10-CM | POA: Insufficient documentation

## 2017-03-09 DIAGNOSIS — K047 Periapical abscess without sinus: Secondary | ICD-10-CM

## 2017-03-09 DIAGNOSIS — F17210 Nicotine dependence, cigarettes, uncomplicated: Secondary | ICD-10-CM | POA: Diagnosis not present

## 2017-03-09 DIAGNOSIS — K0889 Other specified disorders of teeth and supporting structures: Secondary | ICD-10-CM | POA: Diagnosis present

## 2017-03-09 MED ORDER — MAGIC MOUTHWASH W/LIDOCAINE: 5.0000 mL | Freq: Four times a day (QID) | ORAL | 0 refills | Status: DC

## 2017-03-09 MED ORDER — LIDOCAINE-EPINEPHRINE 2 %-1:100000 IJ SOLN
1.7000 mL | Freq: Once | INTRAMUSCULAR | Status: AC
  Administered 2017-03-09: 1.7 mL
  Filled 2017-03-09: qty 1.7

## 2017-03-09 MED ORDER — CLINDAMYCIN HCL 150 MG PO CAPS
300.0000 mg | ORAL_CAPSULE | Freq: Once | ORAL | Status: AC
  Administered 2017-03-09: 300 mg via ORAL
  Filled 2017-03-09: qty 2

## 2017-03-09 MED ORDER — HYDROCODONE-ACETAMINOPHEN 5-325 MG PO TABS: 1.0000 | ORAL_TABLET | ORAL | 0 refills | Status: DC | PRN

## 2017-03-09 MED ORDER — CLINDAMYCIN HCL 300 MG PO CAPS: 300.0000 mg | ORAL_CAPSULE | Freq: Four times a day (QID) | ORAL | 0 refills | Status: DC

## 2017-03-09 NOTE — ED Provider Notes (Signed)
[facility] Emergency Department Provider Note  ____________________________________________  Time seen: Approximately 10:20 PM  I have reviewed the triage vital signs and the nursing notes.   HISTORY  Chief Complaint Dental Pain    HPI Linda Horn is a 51 y.o. female who presents emergency department complaining of left upper dental pain.  Patient reports that she has horrible dentition and is scheduled to have all of her remaining teeth extracted.  Patient reports that she has had multiple dental infections in the past.  Patient reports that she has multiple ulcerations into the gumline and 1 of these has become infected.  Patient denies any drainage.  She denies any fevers or chills, difficulty breathing or swallowing.  Multiple medications over-the-counter for this pain have not been effective.  Past Medical History:  Diagnosis Date  . Anxiety   . Hypertension     Patient Active Problem List   Diagnosis Date Noted  . Benign hypertension 07/26/2009  . Acute situational disturbance 07/26/2009    History reviewed. No pertinent surgical history.  Prior to Admission medications   Medication Sig Start Date End Date Taking? Authorizing Provider  cyclobenzaprine (FLEXERIL) 5 MG tablet Take 1 tablet (5 mg total) by mouth every 8 (eight) hours as needed for muscle spasms. 08/18/15   Hagler, Jami L, PA-C  escitalopram (LEXAPRO) 20 MG tablet Take 1 tablet (20 mg total) by mouth daily. Pt needs to schedule an office visit. 08/28/15   Margarita Rana, MD  hydrochlorothiazide (HYDRODIURIL) 25 MG tablet Take 1 tablet (25 mg total) by mouth daily. Pt needs to schedule an office visit. 08/21/15   Margarita Rana, MD  naproxen (NAPROSYN) 500 MG tablet Take 1 tablet (500 mg total) by mouth 2 (two) times daily with a meal. 08/18/15   Hagler, Jami L, PA-C    Allergies Patient has no known allergies.  No family history on file.  Social History Social History    Tobacco Use  . Smoking status: Current Every Day Smoker    Packs/day: 1.00    Years: 25.00    Pack years: 25.00    Types: Cigarettes  . Smokeless tobacco: Never Used  Substance Use Topics  . Alcohol use: No  . Drug use: No     Review of Systems  Constitutional: No fever/chills ENT: Positive for L upper dental pain Cardiovascular: no chest pain. Respiratory: no cough. No SOB. Gastrointestinal: No abdominal pain.  No nausea, no vomiting.  No diarrhea.  No constipation. Musculoskeletal: Negative for musculoskeletal pain. Skin: Negative for rash, abrasions, lacerations, ecchymosis. Neurological: Negative for headaches, focal weakness or numbness. 10-point ROS otherwise negative.  ____________________________________________   PHYSICAL EXAM:  VITAL SIGNS: ED Triage Vitals [03/09/17 2214]  Enc Vitals Group     BP (!) 177/84     Pulse Rate 74     Resp 18     Temp 98.7 F (37.1 C)     Temp Source Oral     SpO2 98 %     Weight 164 lb (74.4 kg)     Height 5\' 1"  (1.549 m)     Head Circumference      Peak Flow      Pain Score 10     Pain Loc      Pain Edu?      Excl. in [place]?      Constitutional: Alert and oriented. Well appearing and in no acute distress. Eyes: Conjunctivae are normal. PERRL. EOMI. Head: Atraumatic. ENT:  Ears:       Nose: No congestion/rhinnorhea.      Mouth/Throat: Mucous membranes are moist.  Poor dentition throughout.  Multiple missing teeth.  Patient has ulceration to the left posterior lateral gumline with surrounding erythema and edema.  No appreciable abscess.  No drainage of purulent material.  Area is very tender to palpation with tongue depressor. Neck: No stridor.   Hematological/Lymphatic/Immunilogical: No cervical lymphadenopathy. Cardiovascular: Normal rate, regular rhythm. Normal S1 and S2.  Good peripheral circulation. Respiratory: Normal respiratory effort without tachypnea or retractions. Lungs CTAB. Good air entry to the  bases with no decreased or absent breath sounds. Musculoskeletal: Full range of motion to all extremities. No gross deformities appreciated. Neurologic:  Normal speech and language. No gross focal neurologic deficits are appreciated.  Skin:  Skin is warm, dry and intact. No rash noted. Psychiatric: Mood and affect are normal. Speech and behavior are normal. Patient exhibits appropriate insight and judgement.   ____________________________________________   LABS (all labs ordered are listed, but only abnormal results are displayed)  Labs Reviewed - No data to display ____________________________________________  EKG   ____________________________________________  RADIOLOGY   No results found.  ____________________________________________    PROCEDURES  Procedure(s) performed:    .Nerve Block Date/Time: 03/09/2017 10:35 PM Performed by: Darletta Moll, PA-C Authorized by: Darletta Moll, PA-C   Consent:    Consent obtained:  Verbal   Consent given by:  Patient   Risks discussed:  Allergic reaction, pain and unsuccessful block   Alternatives discussed:  No treatment Indications:    Indications:  Pain relief Location:    Body area:  Head   Head nerve blocked: Inferior alveolar nerve (intraoral block)   Laterality:  Left Procedure details (see MAR for exact dosages):    Block needle gauge:  27 G   Anesthetic injected:  Lidocaine 1% WITH epi   Steroid injected:  None   Additive injected:  None   Injection procedure:  Anatomic landmarks identified, incremental injection, negative aspiration for blood and introduced needle Post-procedure details:    Outcome:  Pain improved   Patient tolerance of procedure:  Tolerated well, no immediate complications     Medications  lidocaine-EPINEPHrine (XYLOCAINE W/EPI) 2 %-1:100000 (with pres) injection 1.7 mL (not administered)  clindamycin (CLEOCIN) capsule 300 mg (not administered)      ____________________________________________   INITIAL IMPRESSION / ASSESSMENT AND PLAN / ED COURSE  Pertinent labs & imaging results that were available during my care of the patient were reviewed by me and considered in my medical decision making (see chart for details).  Review of the [place] CSRS was performed in accordance of the [place] prior to dispensing any controlled drugs.     Patient's diagnosis is consistent with dental infection.  Patient was significant dental erosion throughout dentition.  Patient is scheduled to have all dentition removed.  No appreciable abscess.  No indication for labs or imaging.  Patient is given dental block for comfort.  Patient is given first dose of antibiotic in the emergency department.. Patient will be discharged home with prescriptions for clindamycin, Magic mouthwash, very limited prescription of Norco.. Patient is to follow up with dentist as needed or otherwise directed. Patient is given ED precautions to return to the ED for any worsening or new symptoms.     ____________________________________________  FINAL CLINICAL IMPRESSION(S) / ED DIAGNOSES  Final diagnoses:  None      NEW MEDICATIONS STARTED DURING THIS VISIT:  ED Discharge Orders  None          This chart was dictated using voice recognition software/Dragon. Despite best efforts to proofread, errors can occur which can change the meaning. Any change was purely unintentional.    Darletta Moll, PA-C 03/09/17 2243    Arta Silence, MD 03/09/17 2325

## 2017-03-09 NOTE — ED Triage Notes (Signed)
Left upper jaw pain, x3 days, broken tooth noted .

## 2017-05-02 ENCOUNTER — Encounter: Payer: Self-pay | Admitting: *Deleted

## 2017-05-07 ENCOUNTER — Telehealth: Payer: Self-pay

## 2017-05-07 ENCOUNTER — Other Ambulatory Visit: Payer: Self-pay

## 2017-05-07 DIAGNOSIS — Z1211 Encounter for screening for malignant neoplasm of colon: Secondary | ICD-10-CM

## 2017-05-07 MED ORDER — NA SULFATE-K SULFATE-MG SULF 17.5-3.13-1.6 GM/177ML PO SOLN: 1.0000 | Freq: Once | ORAL | 0 refills | Status: AC

## 2017-05-07 NOTE — Telephone Encounter (Signed)
Patient is unable to get her instructions, no email, no my chart.  I've asked the pharmacist if I could fax colonoscopy instructions to them to give to patient when she picks up bowel prep.  They said yes they would give it to her.  Thanks [employer]

## 2017-05-08 ENCOUNTER — Encounter: Payer: Self-pay | Admitting: *Deleted

## 2017-05-08 ENCOUNTER — Other Ambulatory Visit: Payer: Self-pay

## 2017-05-08 NOTE — Discharge Instructions (Signed)
General Anesthesia, Adult, Care After °These instructions provide you with information about caring for yourself after your procedure. Your health care provider may also give you more specific instructions. Your treatment has been planned according to current medical practices, but problems sometimes occur. Call your health care provider if you have any problems or questions after your procedure. °What can I expect after the procedure? °After the procedure, it is common to have: °· Vomiting. °· A sore throat. °· Mental slowness. ° °It is common to feel: °· Nauseous. °· Cold or shivery. °· Sleepy. °· Tired. °· Sore or achy, even in parts of your body where you did not have surgery. ° °Follow these instructions at home: °For at least 24 hours after the procedure: °· Do not: °? Participate in activities where you could fall or become injured. °? Drive. °? Use heavy machinery. °? Drink alcohol. °? Take sleeping pills or medicines that cause drowsiness. °? Make important decisions or sign legal documents. °? Take care of children on your own. °· Rest. °Eating and drinking °· If you vomit, drink water, juice, or soup when you can drink without vomiting. °· Drink enough fluid to keep your urine clear or pale yellow. °· Make sure you have little or no nausea before eating solid foods. °· Follow the diet recommended by your health care provider. °General instructions °· Have a responsible adult stay with you until you are awake and alert. °· Return to your normal activities as told by your health care provider. Ask your health care provider what activities are safe for you. °· Take over-the-counter and prescription medicines only as told by your health care provider. °· If you smoke, do not smoke without supervision. °· Keep all follow-up visits as told by your health care provider. This is important. °Contact a health care provider if: °· You continue to have nausea or vomiting at home, and medicines are not helpful. °· You  cannot drink fluids or start eating again. °· You cannot urinate after 8-12 hours. °· You develop a skin rash. °· You have fever. °· You have increasing redness at the site of your procedure. °Get help right away if: °· You have difficulty breathing. °· You have chest pain. °· You have unexpected bleeding. °· You feel that you are having a life-threatening or urgent problem. °This information is not intended to replace advice given to you by your health care provider. Make sure you discuss any questions you have with your health care provider. °Document Released: 07/01/2000 Document Revised: 08/28/2015 Document Reviewed: 03/09/2015 °Elsevier Interactive Patient Education © 2018 Elsevier Inc. ° °

## 2017-05-09 ENCOUNTER — Ambulatory Visit
Admission: RE | Admit: 2017-05-09 | Discharge: 2017-05-09 | Disposition: A | Discharge status: Home / Self Care | Payer: BLUE CROSS/BLUE SHIELD | Source: Ambulatory Visit | Attending: Gastroenterology | Admitting: Gastroenterology

## 2017-05-09 ENCOUNTER — Ambulatory Visit: Payer: BLUE CROSS/BLUE SHIELD | Admitting: Anesthesiology

## 2017-05-09 ENCOUNTER — Encounter
Admission: RE | Disposition: A | Discharge status: Home / Self Care | Payer: Self-pay | Source: Ambulatory Visit | Attending: Gastroenterology

## 2017-05-09 DIAGNOSIS — D123 Benign neoplasm of transverse colon: Secondary | ICD-10-CM | POA: Diagnosis not present

## 2017-05-09 DIAGNOSIS — K641 Second degree hemorrhoids: Secondary | ICD-10-CM | POA: Insufficient documentation

## 2017-05-09 DIAGNOSIS — Z79899 Other long term (current) drug therapy: Secondary | ICD-10-CM | POA: Diagnosis not present

## 2017-05-09 DIAGNOSIS — I10 Essential (primary) hypertension: Secondary | ICD-10-CM | POA: Insufficient documentation

## 2017-05-09 DIAGNOSIS — K635 Polyp of colon: Secondary | ICD-10-CM | POA: Diagnosis not present

## 2017-05-09 DIAGNOSIS — Z7984 Long term (current) use of oral hypoglycemic drugs: Secondary | ICD-10-CM | POA: Diagnosis not present

## 2017-05-09 DIAGNOSIS — E119 Type 2 diabetes mellitus without complications: Secondary | ICD-10-CM | POA: Diagnosis not present

## 2017-05-09 DIAGNOSIS — F419 Anxiety disorder, unspecified: Secondary | ICD-10-CM | POA: Diagnosis not present

## 2017-05-09 DIAGNOSIS — D125 Benign neoplasm of sigmoid colon: Secondary | ICD-10-CM | POA: Diagnosis not present

## 2017-05-09 DIAGNOSIS — Z1211 Encounter for screening for malignant neoplasm of colon: Secondary | ICD-10-CM | POA: Diagnosis not present

## 2017-05-09 DIAGNOSIS — F17210 Nicotine dependence, cigarettes, uncomplicated: Secondary | ICD-10-CM | POA: Insufficient documentation

## 2017-05-09 HISTORY — PX: POLYPECTOMY: SHX5525

## 2017-05-09 HISTORY — DX: Type 2 diabetes mellitus without complications: E11.9

## 2017-05-09 HISTORY — PX: COLONOSCOPY WITH PROPOFOL: SHX5780

## 2017-05-09 LAB — GLUCOSE, CAPILLARY: Glucose-Capillary: 126 mg/dL — ABNORMAL HIGH (ref 65–99)

## 2017-05-09 SURGERY — COLONOSCOPY WITH PROPOFOL
Anesthesia: General | Wound class: Contaminated

## 2017-05-09 MED ORDER — LIDOCAINE HCL (CARDIAC) 20 MG/ML IV SOLN
INTRAVENOUS | Status: DC | PRN
  Administered 2017-05-09: 40 mg via INTRAVENOUS

## 2017-05-09 MED ORDER — OXYCODONE HCL 5 MG/5ML PO SOLN: 5.0000 mg | Freq: Once | ORAL | Status: DC | PRN

## 2017-05-09 MED ORDER — PROPOFOL 10 MG/ML IV BOLUS
INTRAVENOUS | Status: DC | PRN
  Administered 2017-05-09 (×2): 30 mg via INTRAVENOUS
  Administered 2017-05-09 (×3): 20 mg via INTRAVENOUS
  Administered 2017-05-09: 100 mg via INTRAVENOUS
  Administered 2017-05-09: 50 mg via INTRAVENOUS
  Administered 2017-05-09: 30 mg via INTRAVENOUS

## 2017-05-09 MED ORDER — LACTATED RINGERS IV SOLN
INTRAVENOUS | Status: DC
  Administered 2017-05-09: 11:00:00 via INTRAVENOUS

## 2017-05-09 MED ORDER — OXYCODONE HCL 5 MG PO TABS: 5.0000 mg | ORAL_TABLET | Freq: Once | ORAL | Status: DC | PRN

## 2017-05-09 MED ORDER — STERILE WATER FOR IRRIGATION IR SOLN
Status: DC | PRN
  Administered 2017-05-09: 11:00:00

## 2017-05-09 SURGICAL SUPPLY — 24 items
CANISTER SUCT 1200ML W/VALVE (MISCELLANEOUS) ×4 IMPLANT
CLIP HMST 235XBRD CATH ROT (MISCELLANEOUS) IMPLANT
CLIP RESOLUTION 360 11X235 (MISCELLANEOUS)
ELECT REM PT RETURN 9FT ADLT (ELECTROSURGICAL)
ELECTRODE REM PT RTRN 9FT ADLT (ELECTROSURGICAL) IMPLANT
FCP ESCP3.2XJMB 240X2.8X (MISCELLANEOUS)
FORCEPS BIOP RAD 4 LRG CAP 4 (CUTTING FORCEPS) ×4 IMPLANT
FORCEPS BIOP RJ4 240 W/NDL (MISCELLANEOUS)
FORCEPS ESCP3.2XJMB 240X2.8X (MISCELLANEOUS) IMPLANT
GOWN CVR UNV OPN BCK APRN NK (MISCELLANEOUS) ×4 IMPLANT
GOWN ISOL THUMB LOOP REG UNIV (MISCELLANEOUS) ×4
INJECTOR VARIJECT VIN23 (MISCELLANEOUS) IMPLANT
KIT DEFENDO VALVE AND CONN (KITS) IMPLANT
KIT ENDO PROCEDURE OLY (KITS) ×4 IMPLANT
MARKER SPOT ENDO TATTOO 5ML (MISCELLANEOUS) IMPLANT
PROBE APC STR FIRE (PROBE) IMPLANT
RETRIEVER NET ROTH 2.5X230 LF (MISCELLANEOUS) IMPLANT
SNARE SHORT THROW 13M SML OVAL (MISCELLANEOUS) ×4 IMPLANT
SNARE SHORT THROW 30M LRG OVAL (MISCELLANEOUS) IMPLANT
SNARE SNG USE RND 15MM (INSTRUMENTS) IMPLANT
SPOT EX ENDOSCOPIC TATTOO (MISCELLANEOUS)
TRAP ETRAP POLY (MISCELLANEOUS) ×4 IMPLANT
VARIJECT INJECTOR VIN23 (MISCELLANEOUS)
WATER STERILE IRR 250ML POUR (IV SOLUTION) ×4 IMPLANT

## 2017-05-09 NOTE — Anesthesia Postprocedure Evaluation (Signed)
Anesthesia Post Note  Patient: Linda Horn  Procedure(s) Performed: COLONOSCOPY WITH PROPOFOL (N/A ) POLYPECTOMY  Patient location during evaluation: PACU Anesthesia Type: General Level of consciousness: awake and alert Pain management: pain level controlled Vital Signs Assessment: post-procedure vital signs reviewed and stable Respiratory status: spontaneous breathing Cardiovascular status: blood pressure returned to baseline Postop Assessment: no headache Anesthetic complications: no    Jaci Standard, III,  Zhuri Krass D

## 2017-05-09 NOTE — Op Note (Signed)
[facility] Gastroenterology Patient Name: Linda Horn Procedure Date: 05/09/2017 11:19 AM MRN: [id number] Account #: [id number] Date of Birth: [birth year] Admit Type: Outpatient Age: 52 Room: [facility] OR ROOM 01 Gender: Female Note Status: Finalized Procedure:            Colonoscopy Indications:          Screening for colorectal malignant neoplasm Providers:            Lucilla Lame MD, MD Referring MD:         Wellington Hampshire. Rose (Referring MD) Medicines:            Propofol per Anesthesia Procedure:            Pre-Anesthesia Assessment:                       - Prior to the procedure, a History and Physical was                        performed, and patient medications and allergies were                        reviewed. The patient's tolerance of previous                        anesthesia was also reviewed. The risks and benefits of                        the procedure and the sedation options and risks were                        discussed with the patient. All questions were                        answered, and informed consent was obtained. Prior                        Anticoagulants: The patient has taken no previous                        anticoagulant or antiplatelet agents. ASA Grade                        Assessment: II - A patient with mild systemic disease.                        After reviewing the risks and benefits, the patient was                        deemed in satisfactory condition to undergo the                        procedure.                       After obtaining informed consent, the colonoscope was                        passed under direct vision. Throughout the procedure,  the patient's blood pressure, pulse, and oxygen                        saturations were monitored continuously. The Olympus CF                        H180AL Colonoscope (S#: [id number]) was introduced through                        the anus and advanced to  the the cecum, identified by                        appendiceal orifice and ileocecal valve. The                        colonoscopy was performed without difficulty. The                        patient tolerated the procedure well. The quality of                        the bowel preparation was excellent. Findings:      The perianal and digital rectal examinations were normal.      Three sessile polyps were found in the transverse colon. The polyps were       6 to 9 mm in size. These polyps were removed with a cold snare.       Resection and retrieval were complete. To prevent bleeding       post-intervention, one hemostatic clip was successfully placed (MR       conditional). There was no bleeding at the end of the procedure.      Two sessile polyps were found in the sigmoid colon. The polyps were 3 to       4 mm in size. These polyps were removed with a cold biopsy forceps.       Resection and retrieval were complete.      Non-bleeding internal hemorrhoids were found during retroflexion. The       hemorrhoids were Grade II (internal hemorrhoids that prolapse but reduce       spontaneously). Impression:           - Three 6 to 9 mm polyps in the transverse colon,                        removed with a cold snare. Resected and retrieved. Clip                        (MR conditional) was placed.                       - Two 3 to 4 mm polyps in the sigmoid colon, removed                        with a cold biopsy forceps. Resected and retrieved.                       - Non-bleeding internal hemorrhoids. Recommendation:       - Discharge patient to home.                       -  Resume previous diet.                       - Continue present medications.                       - Await pathology results.                       - Repeat colonoscopy in 5 years if polyp adenoma and 10                        years if hyperplastic Procedure Code(s):    --- Professional ---                       [phone],  Colonoscopy, flexible; with removal of tumor(s),                        polyp(s), or other lesion(s) by snare technique                       [id number], 55, Colonoscopy, flexible; with biopsy, single                        or multiple Diagnosis Code(s):    --- Professional ---                       Z12.11, Encounter for screening for malignant neoplasm                        of colon                       D12.3, Benign neoplasm of transverse colon (hepatic                        flexure or splenic flexure)                       D12.5, Benign neoplasm of sigmoid colon CPT copyright 2016 American Medical Association. All rights reserved. The codes documented in this report are preliminary and upon coder review may  be revised to meet current compliance requirements. Lucilla Lame MD, MD 05/09/2017 11:48:49 AM This report has been signed electronically. Number of Addenda: 0 Note Initiated On: 05/09/2017 11:19 AM Scope Withdrawal Time: 0 hours 7 minutes 49 seconds  Total Procedure Duration: 0 hours 17 minutes 22 seconds       [facility]

## 2017-05-09 NOTE — H&P (Signed)
   Lucilla Lame, MD [facility] [address], [place], [place] [address] Phone: [phone] Fax : [phone]  Primary Care Physician:  Barnie Mort, NP Primary Gastroenterologist:  Dr. Allen Norris  Pre-Procedure History & Physical: HPI:  Linda Horn is a 52 y.o. female is here for a screening colonoscopy.   Past Medical History:  Diagnosis Date  . Anxiety   . Diabetes mellitus, type 2 ([place])   . Hypertension     Past Surgical History:  Procedure Laterality Date  . NO PAST SURGERIES      Prior to Admission medications   Medication Sig Start Date End Date Taking? Authorizing Provider  escitalopram (LEXAPRO) 20 MG tablet Take 1 tablet (20 mg total) by mouth daily. Pt needs to schedule an office visit. 08/28/15  Yes Margarita Rana, MD  hydrochlorothiazide (HYDRODIURIL) 25 MG tablet Take 1 tablet (25 mg total) by mouth daily. Pt needs to schedule an office visit. 08/21/15  Yes Margarita Rana, MD  lovastatin (MEVACOR) 10 MG tablet Take 10 mg by mouth at bedtime.   Yes [provider]  metFORMIN (GLUCOPHAGE) 500 MG tablet Take 1,000 mg by mouth 2 (two) times daily with a meal.   Yes [provider]  magic mouthwash w/lidocaine SOLN Take 5 mLs by mouth 4 (four) times daily. Patient not taking: Reported on 05/08/2017 03/09/17   Cuthriell, Charline Bills, PA-C    Allergies as of 05/07/2017  . (No Known Allergies)    History reviewed. No pertinent family history.  Social History   Socioeconomic History  . Marital status: Married    Spouse name: Not on file  . Number of children: Not on file  . Years of education: Not on file  . Highest education level: Not on file  Social Needs  . Financial resource strain: Not on file  . Food insecurity - worry: Not on file  . Food insecurity - inability: Not on file  . Transportation needs - medical: Not on file  . Transportation needs - non-medical: Not on file  Occupational History  . Not on file  Tobacco Use  .  Smoking status: Current Every Day Smoker    Packs/day: 1.50    Years: 30.00    Pack years: 45.00    Types: Cigarettes  . Smokeless tobacco: Never Used  Substance and Sexual Activity  . Alcohol use: No  . Drug use: No  . Sexual activity: Yes  Other Topics Concern  . Not on file  Social History Narrative  . Not on file    Review of Systems: See HPI, otherwise negative ROS  Physical Exam: BP (!) 144/73   Pulse 66   Temp 98.2 F (36.8 C) (Temporal)   Ht 5\' 1"  (1.549 m)   Wt 158 lb (71.7 kg)   SpO2 97%   BMI 29.85 kg/m  General:   Alert,  pleasant and cooperative in NAD Head:  Normocephalic and atraumatic. Neck:  Supple; no masses or thyromegaly. Lungs:  Clear throughout to auscultation.    Heart:  Regular rate and rhythm. Abdomen:  Soft, nontender and nondistended. Normal bowel sounds, without guarding, and without rebound.   Neurologic:  Alert and  oriented x4;  grossly normal neurologically.  Impression/Plan: Linda Horn is now here to undergo a screening colonoscopy.  Risks, benefits, and alternatives regarding colonoscopy have been reviewed with the patient.  Questions have been answered.  All parties agreeable.

## 2017-05-09 NOTE — Transfer of Care (Signed)
Immediate Anesthesia Transfer of Care Note  Patient: Linda Horn  Procedure(s) Performed: COLONOSCOPY WITH PROPOFOL (N/A ) POLYPECTOMY  Patient Location: PACU  Anesthesia Type: General  Level of Consciousness: awake, alert  and patient cooperative  Airway and Oxygen Therapy: Patient Spontanous Breathing and Patient connected to supplemental oxygen  Post-op Assessment: Post-op Vital signs reviewed, Patient's Cardiovascular Status Stable, Respiratory Function Stable, Patent Airway and No signs of Nausea or vomiting  Post-op Vital Signs: Reviewed and stable  Complications: No apparent anesthesia complications

## 2017-05-09 NOTE — Anesthesia Procedure Notes (Signed)
Procedure Name: MAC Date/Time: 05/09/2017 11:24 AM Performed by: Janna Arch, CRNA Pre-anesthesia Checklist: Patient identified, Emergency Drugs available, Suction available and Patient being monitored Patient Re-evaluated:Patient Re-evaluated prior to induction Oxygen Delivery Method: Nasal cannula

## 2017-05-09 NOTE — Anesthesia Preprocedure Evaluation (Signed)
Anesthesia Evaluation  Patient identified by MRN, date of birth, ID band Patient awake    Reviewed: Allergy & Precautions, H&P , NPO status , Patient's Chart, lab work & pertinent test results  History of Anesthesia Complications Negative for: history of anesthetic complications  Airway Mallampati: II  TM Distance: >3 FB Neck ROM: full    Dental no notable dental hx.    Pulmonary Current Smoker,    Pulmonary exam normal        Cardiovascular hypertension, On Medications Normal cardiovascular exam     Neuro/Psych negative neurological ROS     GI/Hepatic negative GI ROS, Neg liver ROS,   Endo/Other  diabetes, Well Controlled, Type 2  Renal/GU   negative genitourinary   Musculoskeletal   Abdominal   Peds  Hematology negative hematology ROS (+)   Anesthesia Other Findings   Reproductive/Obstetrics                             Anesthesia Physical Anesthesia Plan  ASA: II  Anesthesia Plan: General   Post-op Pain Management:    Induction:   PONV Risk Score and Plan:   Airway Management Planned:   Additional Equipment:   Intra-op Plan:   Post-operative Plan:   Informed Consent: I have reviewed the patients History and Physical, chart, labs and discussed the procedure including the risks, benefits and alternatives for the proposed anesthesia with the patient or authorized representative who has indicated his/her understanding and acceptance.     Plan Discussed with:   Anesthesia Plan Comments:         Anesthesia Quick Evaluation

## 2017-05-13 ENCOUNTER — Encounter: Payer: Self-pay | Admitting: Gastroenterology

## 2017-05-14 ENCOUNTER — Encounter: Payer: Self-pay | Admitting: Gastroenterology

## 2017-06-03 IMAGING — CT CT RENAL STONE PROTOCOL
3 of 4 series · 9 of 46 positions shown, 16 images · non-contrast
Comparison: None.

CLINICAL DATA: Right low back pain since yesterday. No known
injury. Initial encounter.

EXAM:
CT ABDOMEN AND PELVIS WITHOUT CONTRAST
TECHNIQUE: Multidetector CT imaging of the abdomen and pelvis was performed
following the standard protocol without IV contrast.

[Series 4: lung · axial · 0.78mm/px · z∈[-508,-412]mm · 5 of 29 slices shown, 10 images]
[im 5/29  soft-tissue]
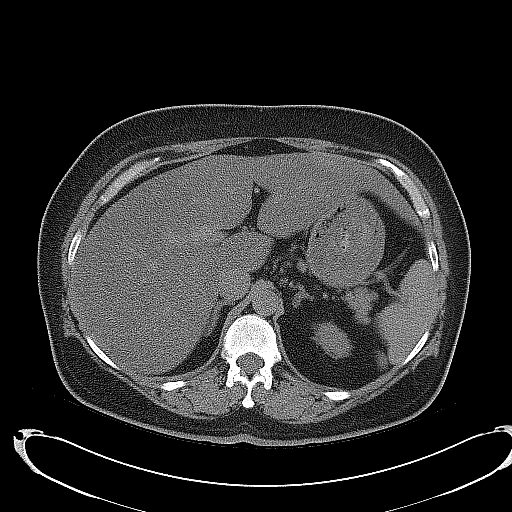
[im 5/29  bone]
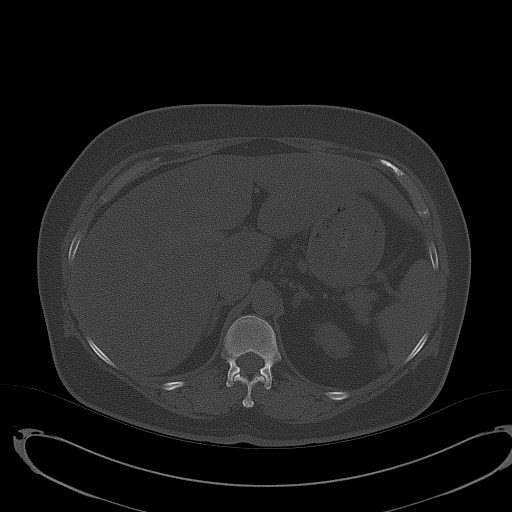
[im 10/29  soft-tissue]
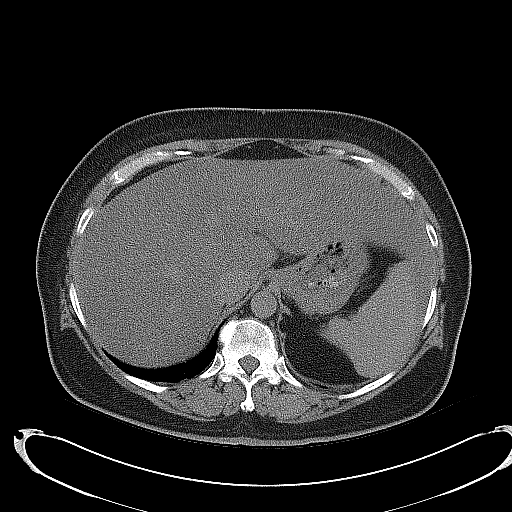
[im 10/29  lung]
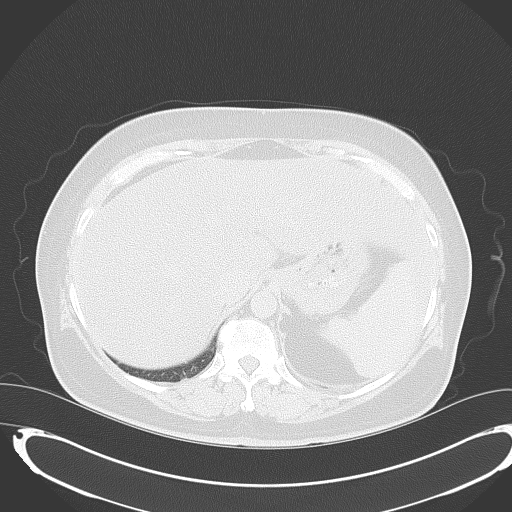
[im 15/29  soft-tissue]
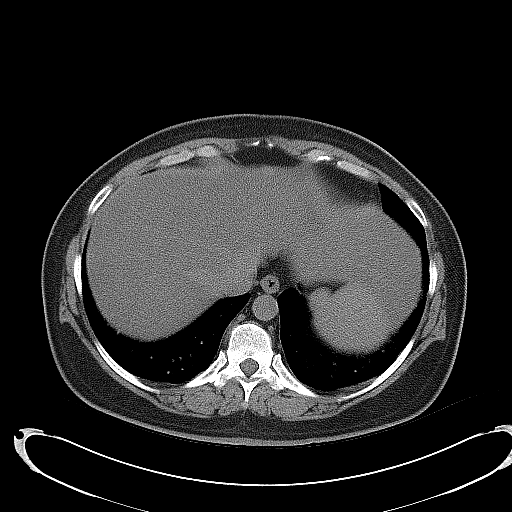
[im 15/29  lung]
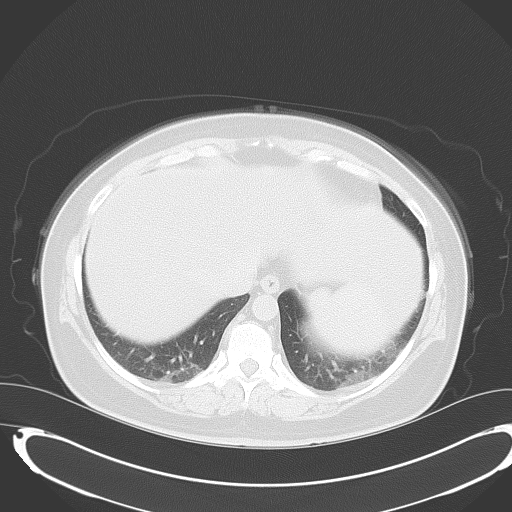
[im 19/29  soft-tissue]
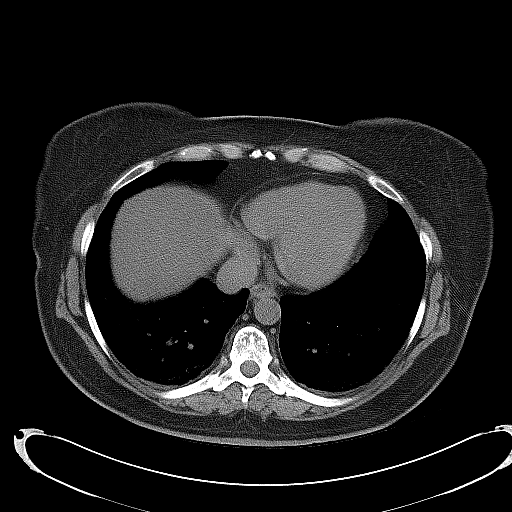
[im 19/29  lung]
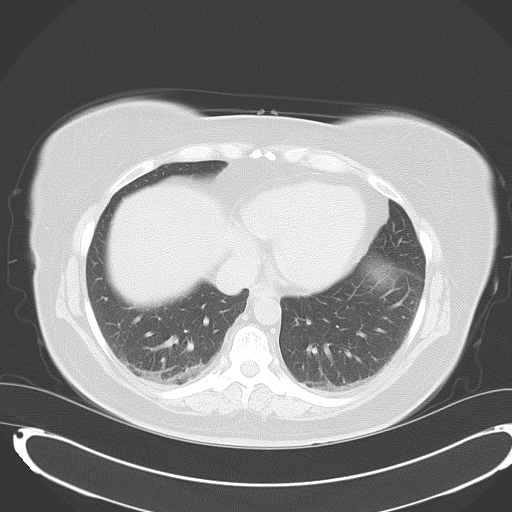
[im 24/29  soft-tissue]
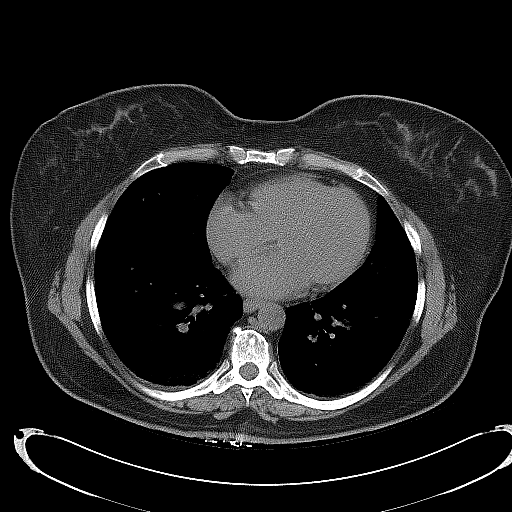
[im 24/29  lung]
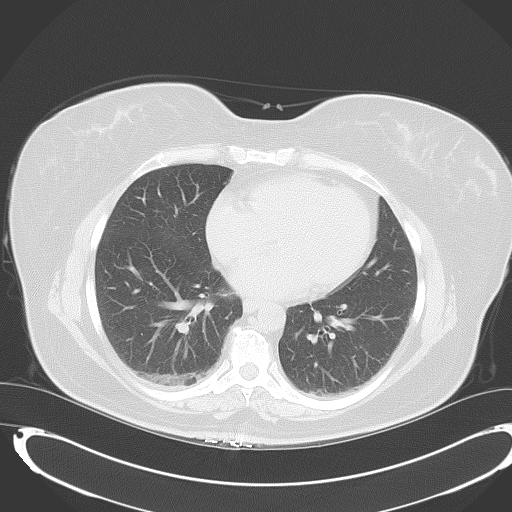

[Series 5: coronal · coronal · 0.81mm/px · 3 of 141 slices shown, 4 images]
[im 47/141  soft-tissue]
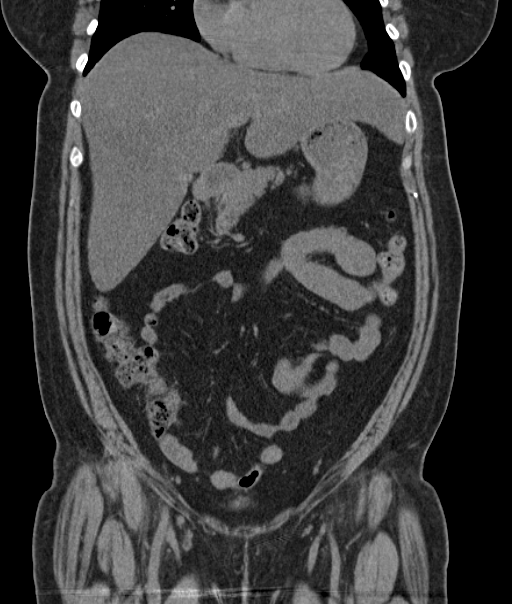
[im 63/141  soft-tissue]
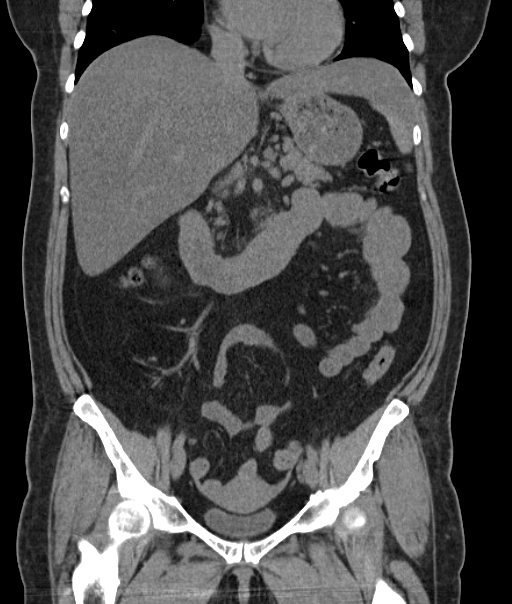
[im 63/141  bone]
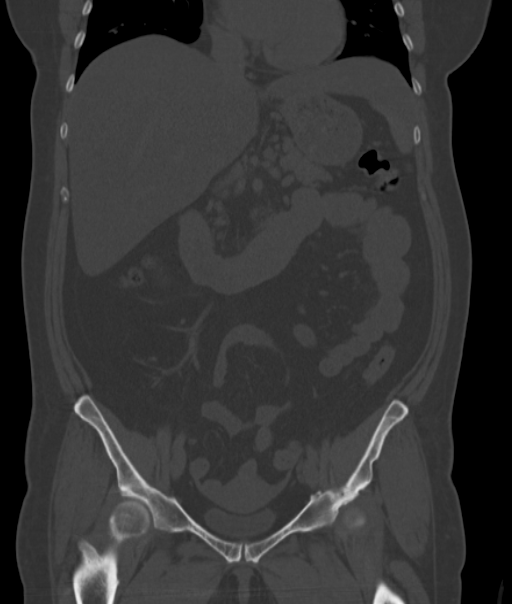
[im 78/141  soft-tissue]
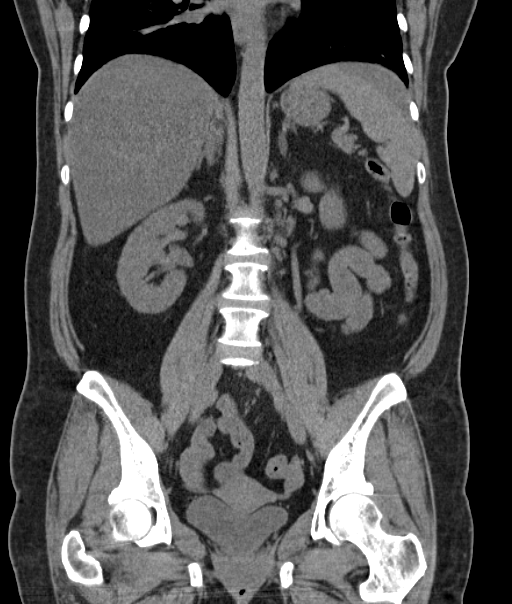

[Series 6: sagittal · sagittal · 0.62mm/px · 1 of 202 slices shown, 2 images]
[im 68/202  soft-tissue]
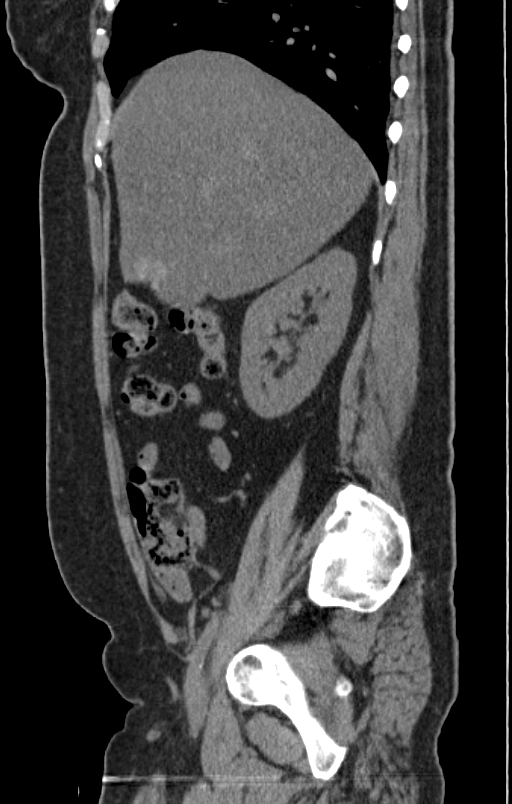
[im 68/202  bone]
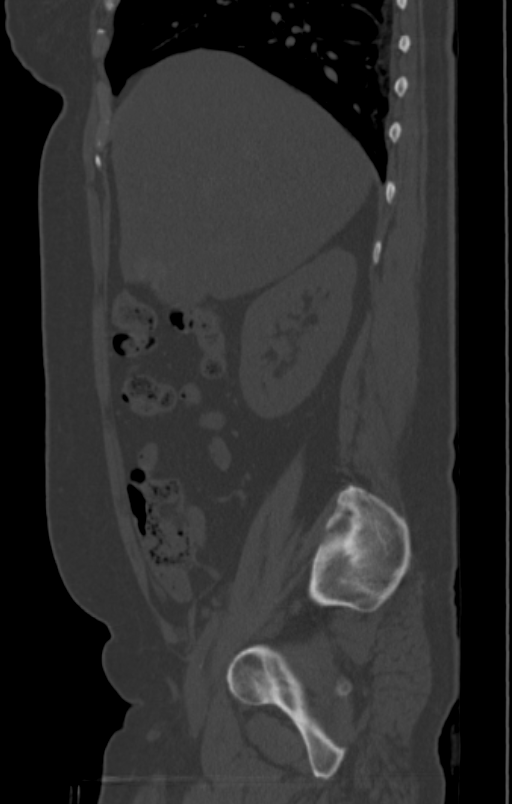

[9 of 46 positions shown; findings below may reference images not displayed]

FINDINGS: The lung bases demonstrate mild dependent atelectasis. No pleural or
pericardial effusion.

No renal or ureteral stones are identified on the right or left. The
kidneys, ureters and urinary bladder are unremarkable. Uterus and
adnexa appear normal.

The liver is diffusely low attenuating consistent with fatty
infiltration. The liver measures approximately 21 cm craniocaudal.
The gallbladder, adrenal glands, spleen, pancreas and biliary tree
appear normal. Aortoiliac atherosclerosis without aneurysm is
identified. The stomach, small and large bowel and appendix appear
normal. No lymphadenopathy or fluid.

No focal bony abnormality. Degenerative disc disease is seen at
L5-S1.
IMPRESSION: Negative for urinary tract stone or acute abnormality.

Fatty infiltration of the liver and hepatomegaly.

Aortoiliac atherosclerosis.

L5-S1 degenerative disc disease.

## 2017-09-03 ENCOUNTER — Other Ambulatory Visit: Payer: Self-pay | Admitting: Family

## 2017-09-03 ENCOUNTER — Ambulatory Visit
Admission: RE | Admit: 2017-09-03 | Discharge: 2017-09-03 | Disposition: A | Discharge status: Home / Self Care | Payer: BLUE CROSS/BLUE SHIELD | Source: Ambulatory Visit | Attending: Family | Admitting: Family

## 2017-09-03 DIAGNOSIS — R945 Abnormal results of liver function studies: Secondary | ICD-10-CM | POA: Diagnosis not present

## 2017-09-03 DIAGNOSIS — R1084 Generalized abdominal pain: Secondary | ICD-10-CM

## 2017-09-03 DIAGNOSIS — K760 Fatty (change of) liver, not elsewhere classified: Secondary | ICD-10-CM | POA: Insufficient documentation

## 2017-09-03 DIAGNOSIS — R7989 Other specified abnormal findings of blood chemistry: Secondary | ICD-10-CM

## 2017-10-02 ENCOUNTER — Encounter (HOSPITAL_COMMUNITY): Payer: Self-pay | Admitting: Emergency Medicine

## 2017-10-02 ENCOUNTER — Emergency Department (HOSPITAL_COMMUNITY): Payer: BLUE CROSS/BLUE SHIELD

## 2017-10-02 ENCOUNTER — Other Ambulatory Visit: Payer: Self-pay

## 2017-10-02 ENCOUNTER — Emergency Department (HOSPITAL_COMMUNITY)
Admission: EM | Admit: 2017-10-02 | Discharge: 2017-10-02 | Disposition: A | Discharge status: Home / Self Care | Payer: BLUE CROSS/BLUE SHIELD | Attending: Emergency Medicine | Admitting: Emergency Medicine

## 2017-10-02 DIAGNOSIS — Y929 Unspecified place or not applicable: Secondary | ICD-10-CM | POA: Insufficient documentation

## 2017-10-02 DIAGNOSIS — W450XXA Nail entering through skin, initial encounter: Secondary | ICD-10-CM | POA: Insufficient documentation

## 2017-10-02 DIAGNOSIS — Y999 Unspecified external cause status: Secondary | ICD-10-CM | POA: Insufficient documentation

## 2017-10-02 DIAGNOSIS — S098XXA Other specified injuries of head, initial encounter: Secondary | ICD-10-CM

## 2017-10-02 DIAGNOSIS — S0180XA Unspecified open wound of other part of head, initial encounter: Secondary | ICD-10-CM | POA: Insufficient documentation

## 2017-10-02 DIAGNOSIS — Z79899 Other long term (current) drug therapy: Secondary | ICD-10-CM | POA: Insufficient documentation

## 2017-10-02 DIAGNOSIS — Z7984 Long term (current) use of oral hypoglycemic drugs: Secondary | ICD-10-CM | POA: Insufficient documentation

## 2017-10-02 DIAGNOSIS — F17210 Nicotine dependence, cigarettes, uncomplicated: Secondary | ICD-10-CM | POA: Insufficient documentation

## 2017-10-02 DIAGNOSIS — Z23 Encounter for immunization: Secondary | ICD-10-CM | POA: Insufficient documentation

## 2017-10-02 DIAGNOSIS — I10 Essential (primary) hypertension: Secondary | ICD-10-CM | POA: Insufficient documentation

## 2017-10-02 DIAGNOSIS — Y939 Activity, unspecified: Secondary | ICD-10-CM | POA: Insufficient documentation

## 2017-10-02 DIAGNOSIS — E119 Type 2 diabetes mellitus without complications: Secondary | ICD-10-CM | POA: Insufficient documentation

## 2017-10-02 MED ORDER — AMOXICILLIN-POT CLAVULANATE 875-125 MG PO TABS: 1.0000 | ORAL_TABLET | Freq: Two times a day (BID) | ORAL | 0 refills | Status: DC

## 2017-10-02 MED ORDER — CEFAZOLIN SODIUM-DEXTROSE 1-4 GM/50ML-% IV SOLN
1.0000 g | Freq: Once | INTRAVENOUS | Status: AC
  Administered 2017-10-02: 1 g via INTRAVENOUS
  Filled 2017-10-02: qty 50

## 2017-10-02 MED ORDER — CEPHALEXIN 500 MG PO CAPS: 500.0000 mg | ORAL_CAPSULE | Freq: Two times a day (BID) | ORAL | 0 refills | Status: DC

## 2017-10-02 MED ORDER — TETANUS-DIPHTH-ACELL PERTUSSIS 5-2.5-18.5 LF-MCG/0.5 IM SUSP
0.5000 mL | Freq: Once | INTRAMUSCULAR | Status: AC
  Administered 2017-10-02: 0.5 mL via INTRAMUSCULAR
  Filled 2017-10-02: qty 0.5

## 2017-10-02 MED ORDER — LIDOCAINE HCL (PF) 1 % IJ SOLN
10.0000 mL | Freq: Once | INTRAMUSCULAR | Status: AC
  Administered 2017-10-02: 10 mL via INTRADERMAL
  Filled 2017-10-02: qty 10

## 2017-10-02 MED ORDER — CEPHALEXIN 500 MG PO CAPS: 500.0000 mg | ORAL_CAPSULE | Freq: Four times a day (QID) | ORAL | 0 refills | Status: AC

## 2017-10-02 NOTE — ED Triage Notes (Signed)
Pt at work [employer] site today, and nail gun richocheted and lodged into left side of head. 18 g LAC saline locked. Denies allergies. Pt hs cspine immobilized to keep head still. Pt AO x 4.

## 2017-10-02 NOTE — ED Provider Notes (Signed)
[facility] EMERGENCY DEPARTMENT Provider Note   CSN: [id number] Arrival date & time: 10/02/17  1441  History   Chief Complaint Chief Complaint  Patient presents with  . Head Injury    HPI Linda Horn is a 52 y.o. female.  The history is provided by the patient and a relative.   52 yo F with PMHx of DM, HTN, who presents after sudden onset head injury just prior to arrival today. Brother was using a gas-powered nail gun, and states a nail went into wood, ricocheted, and hit patient on the left side of the head. No LOC. Patient endorses only moderate burning sensation around site. Unknown tetanus status.   Past Medical History:  Diagnosis Date  . Anxiety   . Diabetes mellitus, type 2 ([place])   . Hypertension     Patient Active Problem List   Diagnosis Date Noted  . Special screening for malignant neoplasms, colon   . Benign neoplasm of transverse colon   . Polyp of sigmoid colon   . Benign hypertension 07/26/2009  . Acute situational disturbance 07/26/2009    Past Surgical History:  Procedure Laterality Date  . COLONOSCOPY WITH PROPOFOL N/A 05/09/2017   Procedure: COLONOSCOPY WITH PROPOFOL;  Surgeon: Lucilla Lame, MD;  Location: [facility];  Service: Endoscopy;  Laterality: N/A;  Diabetes - oral meds  . NO PAST SURGERIES    . POLYPECTOMY  05/09/2017   Procedure: POLYPECTOMY;  Surgeon: Lucilla Lame, MD;  Location: [facility];  Service: Endoscopy;;     OB History   None      Home Medications    Prior to Admission medications   Medication Sig Start Date End Date Taking? Authorizing Provider  cephALEXin (KEFLEX) 500 MG capsule Take 1 capsule (500 mg total) by mouth 4 (four) times daily for 7 days. 10/02/17 10/09/17  Norm Salt, MD  escitalopram (LEXAPRO) 20 MG tablet Take 1 tablet (20 mg total) by mouth daily. Pt needs to schedule an office visit. 08/28/15   Margarita Rana, MD  hydrochlorothiazide (HYDRODIURIL) 25 MG tablet Take 1  tablet (25 mg total) by mouth daily. Pt needs to schedule an office visit. 08/21/15   Margarita Rana, MD  lovastatin (MEVACOR) 10 MG tablet Take 10 mg by mouth at bedtime.    [provider]  magic mouthwash w/lidocaine SOLN Take 5 mLs by mouth 4 (four) times daily. Patient not taking: Reported on 05/08/2017 03/09/17   Cuthriell, Charline Bills, PA-C  metFORMIN (GLUCOPHAGE) 500 MG tablet Take 1,000 mg by mouth 2 (two) times daily with a meal.    [provider]    Family History No family history on file.  Social History Social History   Tobacco Use  . Smoking status: Current Every Day Smoker    Packs/day: 1.50    Years: 30.00    Pack years: 45.00    Types: Cigarettes  . Smokeless tobacco: Never Used  Substance Use Topics  . Alcohol use: No  . Drug use: No     Allergies   Patient has no known allergies.   Review of Systems Review of Systems  Constitutional: Negative for chills and fever.  HENT: Negative for ear pain, hearing loss and sore throat.        Scalp pain  Eyes: Negative for pain and visual disturbance.  Respiratory: Negative for apnea, cough and shortness of breath.   Cardiovascular: Negative for chest pain and palpitations.  Gastrointestinal: Negative for abdominal pain and vomiting.  Musculoskeletal: Negative for arthralgias and back pain.  Skin: Positive for wound. Negative for color change and rash.  Neurological: Negative for dizziness, seizures, syncope and headaches.  Psychiatric/Behavioral: Negative for confusion.  All other systems reviewed and are negative.    Physical Exam Updated Vital Signs BP (!) 151/61   Pulse 82   Ht 5\' 2"  (1.575 m)   Wt 74.8 kg (165 lb)   SpO2 95%   BMI 30.18 kg/m   Physical Exam  Constitutional: She is oriented to person, place, and time. She appears well-developed and well-nourished. No distress.  HENT:  Head: Normocephalic.    Mouth/Throat: Oropharynx is clear and moist.  Eyes: Pupils are equal,  round, and reactive to light. Conjunctivae and EOM are normal.  Neck: Neck supple.  Cardiovascular: Normal rate, regular rhythm and intact distal pulses.  No murmur heard. Pulmonary/Chest: Effort normal and breath sounds normal. No respiratory distress.  Musculoskeletal: She exhibits no edema.  Neurological: She is alert and oriented to person, place, and time.  Skin: Skin is warm and dry.  Psychiatric: She has a normal mood and affect.  Nursing note and vitals reviewed.    ED Treatments / Results  Labs (all labs ordered are listed, but only abnormal results are displayed) Labs Reviewed - No data to display  EKG None  Radiology Dg Skull 1-3 Views  Result Date: 10/02/2017 CLINICAL DATA:  Penetrating head trauma EXAM: SKULL - 1-3 VIEW COMPARISON:  None. FINDINGS: A nail penetrates the skull from the left, at the level of the upper posterior fossa based on the torcula and petrous ridges. It extends deep to the cortex by 4 cm. No regional fracture is seen. IMPRESSION: A nail penetrates the skull from the left at the level of the posterior fossa, extending deep to the cortex by 4 cm. Electronically Signed   By: Monte Fantasia M.D.   On: 10/02/2017 16:05   Ct Head Wo Contrast  Result Date: 10/02/2017 CLINICAL DATA:  Nail gun injury to left side of head with possible retained foreign body. EXAM: CT HEAD WITHOUT CONTRAST TECHNIQUE: Contiguous axial images were obtained from the base of the skull through the vertex without intravenous contrast. COMPARISON:  Plain films same day. FINDINGS: Brain: Ventricles, cisterns and other CSF spaces are within normal. There is no mass, mass effect, shift of midline structures or acute hemorrhage. No evidence of acute infarction. Vascular: No hyperdense vessel or unexpected calcification. Skull: Normal. Negative for fracture or focal lesion. Sinuses/Orbits: Orbits are normal. Paranasal sinuses are clear. There is opacification over the left mastoid air cells  and left middle ear. Other: No evidence of metallic foreign body. IMPRESSION: No acute findings.  No foreign body. Opacification over the left mastoid air cells and middle ear cavity. Electronically Signed   By: Marin Olp M.D.   On: 10/02/2017 16:45    Procedures .Foreign Body Removal Date/Time: 10/02/2017 5:11 PM Performed by: Norm Salt, MD Authorized by: Norm Salt, MD  Consent: Verbal consent obtained. Risks and benefits: risks, benefits and alternatives were discussed Consent given by: patient Patient understanding: patient states understanding of the procedure being performed Patient consent: the patient's understanding of the procedure matches consent given Required items: required blood products, implants, devices, and special equipment available Patient identity confirmed: verbally with patient Time out: Immediately prior to procedure a "time out" was called to verify the correct patient, procedure, equipment, support staff and site/side marked as required. Intake: scalp. Anesthesia: local infiltration  Anesthesia: Local Anesthetic: lidocaine  1% without epinephrine Anesthetic total: 3 mL  Sedation: Patient sedated: no  Patient restrained: no Patient cooperative: yes Complexity: simple 1 objects recovered. Objects recovered: nail Post-procedure assessment: foreign body removed Patient tolerance: Patient tolerated the procedure well with no immediate complications   (including critical care time)  Medications Ordered in ED Medications  Tdap (BOOSTRIX) injection 0.5 mL (has no administration in time range)  ceFAZolin (ANCEF) IVPB 1 g/50 mL premix (has no administration in time range)  lidocaine (PF) (XYLOCAINE) 1 % injection 10 mL (10 mLs Intradermal Given by Other 10/02/17 1710)     Initial Impression / Assessment and Plan / ED Course  I have reviewed the triage vital signs and the nursing notes.  Pertinent labs & imaging results that were available during  my care of the patient were reviewed by me and considered in my medical decision making (see chart for details).     Linda Horn is a 52 y.o. female with PMHx of HTN, DM who p/w penetrating head injury after nail impaled scalp accidentally this afternoon. Reviewed and confirmed nursing documentation for past medical history, family history, social history. VS afebrile, wnl. Exam remarkable for nail protruding from left temporal scalp, just inferior to ear, slightly mobile. Likely in the subQ tissue.   Tdap updated. IV ancef given. Xray skull with penetrating nail into skull, states extends into cortex 4cm, though less likely- nail is more posterior. Nail removed as above easily, appears to have only been embedded at approx 1.5 cm. CT head negative for acute fractures.   Old records reviewedImaging viewed and interpreted by me and used in the medical decision making (formal interpretation from radiologist). D/c home in stable condition, return precautions discussed. Patient agreeable with plan for d/c home.    Final Clinical Impressions(s) / ED Diagnoses   Final diagnoses:  Penetrating head trauma    ED Discharge Orders        Ordered    amoxicillin-clavulanate (AUGMENTIN) 875-125 MG tablet  Every 12 hours,   Status:  Discontinued     10/02/17 1653    cephALEXin (KEFLEX) 500 MG capsule  2 times daily,   Status:  Discontinued     10/02/17 1702    cephALEXin (KEFLEX) 500 MG capsule  4 times daily     10/02/17 1702       Norm Salt, MD 10/02/17 1712    Tanna Furry, MD 10/03/17 [phone]

## 2018-05-05 ENCOUNTER — Telehealth: Payer: Self-pay | Admitting: Adult Health

## 2018-05-05 NOTE — Telephone Encounter (Signed)
Received a call from [facility] to schedule the pt for a hem appt. Appt has been scheduled for the pt to see Wilber Bihari on 2/5 at 830am. Aware that the pt should arrive 30 minutes early. Provided them the fax number to send the pt's records.

## 2018-05-13 ENCOUNTER — Other Ambulatory Visit: Payer: Self-pay

## 2018-05-13 ENCOUNTER — Inpatient Hospital Stay: Payer: BLUE CROSS/BLUE SHIELD | Attending: Adult Health | Admitting: Adult Health

## 2018-05-13 ENCOUNTER — Encounter: Payer: Self-pay | Admitting: Adult Health

## 2018-05-13 ENCOUNTER — Inpatient Hospital Stay: Payer: BLUE CROSS/BLUE SHIELD

## 2018-05-13 VITALS — BP 134/65 | HR 65 | Temp 98.1°F | Resp 18 | Ht 62.0 in | Wt 163.8 lb

## 2018-05-13 DIAGNOSIS — M25559 Pain in unspecified hip: Secondary | ICD-10-CM | POA: Diagnosis not present

## 2018-05-13 DIAGNOSIS — M545 Low back pain: Secondary | ICD-10-CM | POA: Diagnosis not present

## 2018-05-13 DIAGNOSIS — E119 Type 2 diabetes mellitus without complications: Secondary | ICD-10-CM

## 2018-05-13 DIAGNOSIS — D729 Disorder of white blood cells, unspecified: Secondary | ICD-10-CM

## 2018-05-13 DIAGNOSIS — I10 Essential (primary) hypertension: Secondary | ICD-10-CM | POA: Diagnosis not present

## 2018-05-13 DIAGNOSIS — F419 Anxiety disorder, unspecified: Secondary | ICD-10-CM | POA: Diagnosis not present

## 2018-05-13 DIAGNOSIS — Z720 Tobacco use: Secondary | ICD-10-CM | POA: Diagnosis not present

## 2018-05-13 DIAGNOSIS — G8929 Other chronic pain: Secondary | ICD-10-CM

## 2018-05-13 DIAGNOSIS — D72829 Elevated white blood cell count, unspecified: Secondary | ICD-10-CM | POA: Diagnosis not present

## 2018-05-13 LAB — CBC WITH DIFFERENTIAL (CANCER CENTER ONLY)
Abs Immature Granulocytes: 0.05 10*3/uL (ref 0.00–0.07)
BASOS ABS: 0.1 10*3/uL (ref 0.0–0.1)
Basophils Relative: 1 %
EOS ABS: 0.2 10*3/uL (ref 0.0–0.5)
EOS PCT: 2 %
HEMATOCRIT: 43.5 % (ref 36.0–46.0)
HEMOGLOBIN: 14.4 g/dL (ref 12.0–15.0)
IMMATURE GRANULOCYTES: 0 %
LYMPHS ABS: 3.6 10*3/uL (ref 0.7–4.0)
LYMPHS PCT: 29 %
MCH: 30.2 pg (ref 26.0–34.0)
MCHC: 33.1 g/dL (ref 30.0–36.0)
MCV: 91.2 fL (ref 80.0–100.0)
Monocytes Absolute: 0.7 10*3/uL (ref 0.1–1.0)
Monocytes Relative: 6 %
NEUTROS PCT: 62 %
NRBC: 0 % (ref 0.0–0.2)
Neutro Abs: 7.7 10*3/uL (ref 1.7–7.7)
Platelet Count: 339 10*3/uL (ref 150–400)
RBC: 4.77 MIL/uL (ref 3.87–5.11)
RDW: 13.3 % (ref 11.5–15.5)
WBC: 12.3 10*3/uL — AB (ref 4.0–10.5)

## 2018-05-13 LAB — C-REACTIVE PROTEIN

## 2018-05-13 NOTE — Progress Notes (Signed)
[facility]  Telephone:([phone]) [phone] Fax:(336) [phone]     ID: Linda Horn DOB: [birth year]  MR#: [id number]  TWK#:[id number]  Patient Care Team: Barnie Mort, NP as PCP - General (Nurse Practitioner) Harriette Ohara, MD OTHER MD:  CHIEF COMPLAINT: leukocytosis, neutrophilia  HISTORY OF CURRENT ILLNESS: Linda Horn is here due to what she notes as a 1 year h/o elevated WBC.  She has not noted any other symptoms, or health issues related to this during the past year.  Her PCP recommended evaluation since her WBC had persistently been elevated.  The patient's subsequent history is as detailed below.  INTERVAL HISTORY: Linda Horn is accompanied by her sister Joelene Millin.  Linda Horn is doing well.  She has had no recent infections.  She has had a slight liver enzyme elevation previously.  She also has chronic back and shoulder pain issues.  She is a current every day smoker of 40 pack years and is not yet ready to quit.  She denies any easy bruising/bleeding, fatigue, night sweats, weight loss, or any other issues.     REVIEW OF SYSTEMS:  Timberlyn denies vision changes, unusual headaches, lymphadenopathy.  She is without nausea, vomiting, dysphagia, bowel/bladder concerns.  She denies chest pain, cough, shortness of breath or palpitations.  A detailed ROS was otherwise non contributory.    PAST MEDICAL HISTORY: Past Medical History:  Diagnosis Date  . Anxiety   . Diabetes mellitus, type 2 ([place])   . Hypertension     PAST SURGICAL HISTORY: Past Surgical History:  Procedure Laterality Date  . COLONOSCOPY WITH PROPOFOL N/A 05/09/2017   Procedure: COLONOSCOPY WITH PROPOFOL;  Surgeon: Lucilla Lame, MD;  Location: [facility];  Service: Endoscopy;  Laterality: N/A;  Diabetes - oral meds  . NO PAST SURGERIES    . POLYPECTOMY  05/09/2017   Procedure: POLYPECTOMY;  Surgeon: Lucilla Lame, MD;  Location: [facility];  Service: Endoscopy;;    FAMILY HISTORY Family  History  Problem Relation Age of Onset  . Lung cancer Mother   . Diabetes Mellitus II Father   . Heart disease Father   . Lung cancer Father   . Diabetes Mellitus II Sister   . Diabetes Mellitus II Brother   . Diabetes Mellitus II Maternal Grandmother   . Lymphoma Maternal Aunt   . Heart disease Maternal Aunt        SOCIAL HISTORY: lives in [place] with her husband and 54 year old daughter.  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  She smokes 1ppd, drinks ETOH occasionally, and does not do any drugs.       ADVANCED DIRECTIVES:    HEALTH MAINTENANCE: Social History   Tobacco Use  . Smoking status: Current Every Day Smoker    Packs/day: 1.50    Years: 30.00    Pack years: 45.00    Types: Cigarettes  . Smokeless tobacco: Never Used  Substance Use Topics  . Alcohol use: No  . Drug use: No     Colonoscopy:05/2017  PAP: 10/2017  Bone density: n/a  CT lung cancer screen: due in 11/2019   No Known Allergies  Current Outpatient Medications  Medication Sig Dispense Refill  . escitalopram (LEXAPRO) 20 MG tablet Take 1 tablet (20 mg total) by mouth daily. Pt needs to schedule an office visit. 15 tablet 0  . hydrochlorothiazide (HYDRODIURIL) 25 MG tablet Take 1 tablet (25 mg total) by mouth daily. Pt needs to schedule an office visit.  15 tablet 0  . lovastatin (MEVACOR) 10 MG tablet Take 10 mg by mouth at bedtime.    . metFORMIN (GLUCOPHAGE) 500 MG tablet Take 1,000 mg by mouth 2 (two) times daily with a meal.     No current facility-administered medications for this visit.     OBJECTIVE:  Vitals:   05/13/18 0831  BP: 134/65  Pulse: 65  Resp: 18  Temp: 98.1 F (36.7 C)  SpO2: 95%     Body mass index is 29.96 kg/m.   Wt Readings from Last 3 Encounters:  05/13/18 74.3 kg  10/02/17 74.8 kg  05/09/17 71.7 kg      ECOG FS:1 - Symptomatic but completely ambulatory GENERAL: Patient is a well appearing female in no acute distress HEENT:  Sclerae  anicteric.  Oropharynx clear and moist. No ulcerations or evidence of oropharyngeal candidiasis. Neck is supple.  NODES:  No cervical, supraclavicular, or axillary lymphadenopathy palpated.  LUNGS:  Clear to auscultation bilaterally.  No wheezes or rhonchi. HEART:  Regular rate and rhythm. No murmur appreciated. ABDOMEN:  Soft, nontender.  Positive, normoactive bowel sounds. No organomegaly palpated. MSK:  No focal spinal tenderness to palpation. EXTREMITIES:  No peripheral edema.   SKIN:  Clear with no obvious rashes or skin changes. No nail dyscrasia. NEURO:  Nonfocal. Well oriented.  Appropriate affect.     LAB RESULTS:  CMP     Component Value Date/Time   NA 140 08/18/2015 1014   NA 141 12/27/2013 2013   K 3.8 08/18/2015 1014   K 3.5 12/27/2013 2013   CL 108 08/18/2015 1014   CL 111 (H) 12/27/2013 2013   CO2 27 08/18/2015 1014   CO2 23 12/27/2013 2013   GLUCOSE 148 (H) 08/18/2015 1014   GLUCOSE 97 12/27/2013 2013   BUN 10 08/18/2015 1014   BUN 6 (L) 12/27/2013 2013   CREATININE 0.53 08/18/2015 1014   CREATININE 0.61 12/27/2013 2013   CALCIUM 9.1 08/18/2015 1014   CALCIUM 8.5 12/27/2013 2013   GFRNONAA >60 08/18/2015 1014   GFRNONAA >60 12/27/2013 2013   GFRAA >60 08/18/2015 1014   GFRAA >60 12/27/2013 2013    No results found for: TOTALPROTELP, ALBUMINELP, A1GS, A2GS, BETS, BETA2SER, GAMS, MSPIKE, SPEI  No results found for: Nils Pyle, [facility]  Lab Results  Component Value Date   WBC 12.3 (H) 05/13/2018   NEUTROABS 7.7 05/13/2018   HGB 14.4 05/13/2018   HCT 43.5 05/13/2018   MCV 91.2 05/13/2018   PLT 339 05/13/2018      Chemistry      Component Value Date/Time   NA 140 08/18/2015 1014   NA 141 12/27/2013 2013   K 3.8 08/18/2015 1014   K 3.5 12/27/2013 2013   CL 108 08/18/2015 1014   CL 111 (H) 12/27/2013 2013   CO2 27 08/18/2015 1014   CO2 23 12/27/2013 2013   BUN 10 08/18/2015 1014   BUN 6 (L) 12/27/2013 2013   CREATININE 0.53  08/18/2015 1014   CREATININE 0.61 12/27/2013 2013      Component Value Date/Time   CALCIUM 9.1 08/18/2015 1014   CALCIUM 8.5 12/27/2013 2013       No results found for: LABCA2  No components found for: [id number]  No results for input(s): INR in the last 168 hours.  No results found for: LABCA2  No results found for: WJX914  No results found for: NWG956  No results found for: OZH086  No results found for: CA2729  No components  found for: HGQUANT  No results found for: CEA1 / No results found for: CEA1   No results found for: AFPTUMOR  No results found for: CHROMOGRNA  No results found for: PSA1  Appointment on 05/13/2018  Component Date Value Ref Range Status  . WBC Count 05/13/2018 12.3* 4.0 - 10.5 K/uL Final  . RBC 05/13/2018 4.77  3.87 - 5.11 MIL/uL Final  . Hemoglobin 05/13/2018 14.4  12.0 - 15.0 g/dL Final  . HCT 05/13/2018 43.5  36.0 - 46.0 % Final  . MCV 05/13/2018 91.2  80.0 - 100.0 fL Final  . MCH 05/13/2018 30.2  26.0 - 34.0 pg Final  . MCHC 05/13/2018 33.1  30.0 - 36.0 g/dL Final  . RDW 05/13/2018 13.3  11.5 - 15.5 % Final  . Platelet Count 05/13/2018 339  150 - 400 K/uL Final  . nRBC 05/13/2018 0.0  0.0 - 0.2 % Final  . Neutrophils Relative % 05/13/2018 62  % Final  . Neutro Abs 05/13/2018 7.7  1.7 - 7.7 K/uL Final  . Lymphocytes Relative 05/13/2018 29  % Final  . Lymphs Abs 05/13/2018 3.6  0.7 - 4.0 K/uL Final  . Monocytes Relative 05/13/2018 6  % Final  . Monocytes Absolute 05/13/2018 0.7  0.1 - 1.0 K/uL Final  . Eosinophils Relative 05/13/2018 2  % Final  . Eosinophils Absolute 05/13/2018 0.2  0.0 - 0.5 K/uL Final  . Basophils Relative 05/13/2018 1  % Final  . Basophils Absolute 05/13/2018 0.1  0.0 - 0.1 K/uL Final  . Immature Granulocytes 05/13/2018 0  % Final  . Abs Immature Granulocytes 05/13/2018 0.05  0.00 - 0.07 K/uL Final   Performed at [facility], [place] [address] [place], [place] [address]  . CRP 05/13/2018  <0.8  <1.0 mg/dL Final   Performed at [facility] [address] [place], [place] [address]    (this displays the last labs from the last 3 days)  No results found for: TOTALPROTELP, ALBUMINELP, A1GS, A2GS, BETS, BETA2SER, GAMS, MSPIKE, SPEI (this displays SPEP labs)  No results found for: KPAFRELGTCHN, LAMBDASER, KAPLAMBRATIO (kappa/lambda light chains)  No results found for: HGBA, HGBA2QUANT, HGBFQUANT, HGBSQUAN (Hemoglobinopathy evaluation)   No results found for: LDH  No results found for: IRON, TIBC, IRONPCTSAT (Iron and TIBC)  No results found for: FERRITIN  Urinalysis    Component Value Date/Time   COLORURINE YELLOW (A) 08/18/2015 0913   APPEARANCEUR HAZY (A) 08/18/2015 0913   LABSPEC 1.022 08/18/2015 0913   PHURINE 5.0 08/18/2015 0913   GLUCOSEU >500 (A) 08/18/2015 0913   HGBUR 1+ (A) 08/18/2015 0913   BILIRUBINUR NEGATIVE 08/18/2015 0913   KETONESUR NEGATIVE 08/18/2015 0913   PROTEINUR NEGATIVE 08/18/2015 0913   NITRITE NEGATIVE 08/18/2015 0913   LEUKOCYTESUR NEGATIVE 08/18/2015 0913     STUDIES: No results found.    ASSESSMENT/PLAN: 53 y.o. woman with PMH of diabetes, hypertension and anxiety being seen in our office today for neutrophilia.  After review of the available labs and discussion with patient, she met with Dr. Lindi Adie.  We reviewed that she has a very mild neutrophilia.  This was explained in detail, that this type of neutrophilia is due to infection or inflammation.  She has had no recent infections.  She has three possible sources of inflammation: NASH, chronic back pain, and chronic bronchitis/COPD.  We will check a CBC and CRP again today.  We recommended smoking cessation.  We recommended chronic back pain management if needed.  For now, we can see Russell on an as needed basis.  Would recommend that she RTC for WBC over 20, an [place] over 15 or if she develops any new cytopenias.     Analiese has a good understanding of the  overall plan. She agrees with it. She knows that she can call us for any questions or concerns that she may have.    Wilber Bihari, NP   05/13/2018 11:14 AM Medical Oncology and Hematology [facility] [address] [place], [place] [address] Tel. [phone]    Fax. [phone]   Attending Note  I personally saw and examined LUPE HANDLEY. The plan of care was discussed with her. I agree with the assessment and plan as documented above. Elevated neutrophil count: I strongly suspect inflammation is underlying etiology.  We will repeat the CBC and look at the smear as well as check C-reactive protein.  The potential causes of inflammation include back pain, chronic bronchitis from tobacco use, [place]/fatty liver.  If the white blood cell count progressively increases or other cytopenias develop we are happy to see the patient.  We will call the patient with the results of today's blood work.  Thank very much for consulting [place]. Signed Harriette Ohara, MD

## 2019-01-04 ENCOUNTER — Other Ambulatory Visit: Payer: Self-pay | Admitting: Nurse Practitioner

## 2019-01-04 DIAGNOSIS — R42 Dizziness and giddiness: Secondary | ICD-10-CM

## 2019-01-04 DIAGNOSIS — G8929 Other chronic pain: Secondary | ICD-10-CM

## 2019-01-16 ENCOUNTER — Ambulatory Visit
Admission: RE | Admit: 2019-01-16 | Discharge: 2019-01-16 | Disposition: A | Discharge status: Home / Self Care | Payer: BC Managed Care – PPO | Source: Ambulatory Visit | Attending: Nurse Practitioner | Admitting: Nurse Practitioner

## 2019-01-16 DIAGNOSIS — G8929 Other chronic pain: Secondary | ICD-10-CM | POA: Diagnosis not present

## 2019-01-16 DIAGNOSIS — R42 Dizziness and giddiness: Secondary | ICD-10-CM | POA: Diagnosis present

## 2020-03-02 ENCOUNTER — Other Ambulatory Visit: Payer: Self-pay | Admitting: Nurse Practitioner

## 2020-03-02 DIAGNOSIS — U071 COVID-19: Secondary | ICD-10-CM

## 2020-03-02 NOTE — Progress Notes (Signed)
I connected by phone with Linda Horn on 03/02/2020 at 9:22 AM to discuss the potential use of a treatment for mild to moderate COVID-19 viral infection in non-hospitalized patients.  This patient is a 54 y.o. female that meets the FDA criteria for Emergency Use Authorization of bamlanivimab/etesevimab, casirivimab\imdevimab, or sotrovimab  Has a (+) direct SARS-CoV-2 viral test result  Has mild or moderate COVID-19   Is ? 55 years of age and weighs ? 40 kg  Is NOT hospitalized due to COVID-19  Is NOT requiring oxygen therapy or requiring an increase in baseline oxygen flow rate due to COVID-19  Is within 10 days of symptom onset  Has at least one of the high risk factor(s) for progression to severe COVID-19 and/or hospitalization as defined in EUA.  Specific high risk criteria : BMI > 25 and Cardiovascular disease or hypertension   I have spoken and communicated the following to the patient or parent/caregiver:  1. FDA has authorized the emergency use of bamlanivimab/etesevimab, casirivimab\imdevimab, or sotrovimab for the treatment of mild to moderate COVID-19 in adults and pediatric patients with positive results of direct SARS-CoV-2 viral testing who are 63 years of age and older weighing at least 40 kg, and who are at high risk for progressing to severe COVID-19 and/or hospitalization.  2. The significant known and potential risks and benefits of bamlanivimab/etesevimab, casirivimab\imdevimab, or sotrovimab, and the extent to which such potential risks and benefits are unknown.  3. Information on available alternative treatments and the risks and benefits of those alternatives, including clinical trials.  4. Patients treated with bamlanivimab/etesevimab, casirivimab\imdevimab, or sotrovimab should continue to self-isolate and use infection control measures (e.g., wear mask, isolate, social distance, avoid sharing personal items, clean and disinfect "high touch" surfaces, and  frequent handwashing) according to CDC guidelines.   5. The patient or parent/caregiver has the option to accept or refuse bamlanivimab/etesevimab, casirivimab\imdevimab, or sotrovimab.  After reviewing this information with the patient, the patient has agreed to receive one of the available covid 19 monoclonal antibodies and will be provided an appropriate fact sheet prior to infusion.Beckey Rutter, [place], AGNP-C [phone] ([place])

## 2020-03-03 ENCOUNTER — Other Ambulatory Visit (HOSPITAL_COMMUNITY): Payer: Self-pay

## 2020-03-03 ENCOUNTER — Ambulatory Visit (HOSPITAL_COMMUNITY)
Admission: RE | Admit: 2020-03-03 | Discharge: 2020-03-03 | Disposition: A | Discharge status: Home / Self Care | Payer: HRSA Program | Source: Ambulatory Visit | Attending: Pulmonary Disease | Admitting: Pulmonary Disease

## 2020-03-03 DIAGNOSIS — U071 COVID-19: Secondary | ICD-10-CM | POA: Insufficient documentation

## 2020-03-03 MED ORDER — SODIUM CHLORIDE 0.9 % IV SOLN: INTRAVENOUS | Status: DC | PRN

## 2020-03-03 MED ORDER — ALBUTEROL SULFATE HFA 108 (90 BASE) MCG/ACT IN AERS: 2.0000 | INHALATION_SPRAY | Freq: Once | RESPIRATORY_TRACT | Status: DC | PRN

## 2020-03-03 MED ORDER — METHYLPREDNISOLONE SODIUM SUCC 125 MG IJ SOLR: 125.0000 mg | Freq: Once | INTRAMUSCULAR | Status: DC | PRN

## 2020-03-03 MED ORDER — DIPHENHYDRAMINE HCL 50 MG/ML IJ SOLN: 50.0000 mg | Freq: Once | INTRAMUSCULAR | Status: DC | PRN

## 2020-03-03 MED ORDER — FAMOTIDINE IN NACL 20-0.9 MG/50ML-% IV SOLN: 20.0000 mg | Freq: Once | INTRAVENOUS | Status: DC | PRN

## 2020-03-03 MED ORDER — SOTROVIMAB 500 MG/8ML IV SOLN
500.0000 mg | Freq: Once | INTRAVENOUS | Status: AC
  Administered 2020-03-03: 500 mg via INTRAVENOUS

## 2020-03-03 MED ORDER — EPINEPHRINE 0.3 MG/0.3ML IJ SOAJ: 0.3000 mg | Freq: Once | INTRAMUSCULAR | Status: DC | PRN

## 2020-03-03 NOTE — Progress Notes (Signed)
Patient reviewed Fact Sheet for Patients, Parents, and Caregivers for Emergency Use Authorization (EUA) of Sotrovimab for the Treatment of Coronavirus. Patient also reviewed and is agreeable to the estimated cost of treatment. Patient is agreeable to proceed.   

## 2020-03-03 NOTE — Progress Notes (Signed)
  Diagnosis: COVID-19  Physician: Dr. Asencion Noble   Procedure:   Medication fact sheet provided to patient; all questions answered.  Allergies reviewed with patient.  IV placed.  Sotrovimab administered via IV infusion.   Complications: No immediate complications noted.  Discharge: Discharged home   Monna Fam 03/03/2020

## 2020-03-03 NOTE — Discharge Instructions (Signed)

## 2020-08-29 ENCOUNTER — Other Ambulatory Visit: Payer: Self-pay | Admitting: Family Medicine

## 2020-08-29 ENCOUNTER — Ambulatory Visit
Admission: RE | Admit: 2020-08-29 | Discharge: 2020-08-29 | Disposition: A | Discharge status: Home / Self Care | Payer: 59 | Source: Ambulatory Visit | Attending: Family Medicine | Admitting: Family Medicine

## 2020-08-29 ENCOUNTER — Other Ambulatory Visit: Payer: Self-pay

## 2020-08-29 DIAGNOSIS — M545 Low back pain, unspecified: Secondary | ICD-10-CM | POA: Insufficient documentation

## 2020-08-29 DIAGNOSIS — M5410 Radiculopathy, site unspecified: Secondary | ICD-10-CM | POA: Insufficient documentation

## 2020-09-08 ENCOUNTER — Emergency Department
Admission: EM | Admit: 2020-09-08 | Discharge: 2020-09-08 | Disposition: A | Discharge status: Home / Self Care | Payer: 59 | Attending: Emergency Medicine | Admitting: Emergency Medicine

## 2020-09-08 ENCOUNTER — Other Ambulatory Visit: Payer: Self-pay

## 2020-09-08 ENCOUNTER — Encounter: Payer: Self-pay | Admitting: Emergency Medicine

## 2020-09-08 DIAGNOSIS — M5441 Lumbago with sciatica, right side: Secondary | ICD-10-CM | POA: Diagnosis not present

## 2020-09-08 DIAGNOSIS — I10 Essential (primary) hypertension: Secondary | ICD-10-CM | POA: Diagnosis not present

## 2020-09-08 DIAGNOSIS — Z79899 Other long term (current) drug therapy: Secondary | ICD-10-CM | POA: Insufficient documentation

## 2020-09-08 DIAGNOSIS — E119 Type 2 diabetes mellitus without complications: Secondary | ICD-10-CM | POA: Insufficient documentation

## 2020-09-08 DIAGNOSIS — Z7984 Long term (current) use of oral hypoglycemic drugs: Secondary | ICD-10-CM | POA: Insufficient documentation

## 2020-09-08 DIAGNOSIS — F17210 Nicotine dependence, cigarettes, uncomplicated: Secondary | ICD-10-CM | POA: Diagnosis not present

## 2020-09-08 DIAGNOSIS — M545 Low back pain, unspecified: Secondary | ICD-10-CM | POA: Diagnosis present

## 2020-09-08 MED ORDER — METHOCARBAMOL 500 MG PO TABS: ORAL_TABLET | ORAL | 0 refills | Status: DC

## 2020-09-08 MED ORDER — METHOCARBAMOL 500 MG PO TABS
1000.0000 mg | ORAL_TABLET | Freq: Once | ORAL | Status: AC
  Administered 2020-09-08: 1000 mg via ORAL
  Filled 2020-09-08: qty 2

## 2020-09-08 MED ORDER — NAPROXEN 500 MG PO TABS: 500.0000 mg | ORAL_TABLET | Freq: Two times a day (BID) | ORAL | 0 refills | Status: DC

## 2020-09-08 MED ORDER — HYDROCODONE-ACETAMINOPHEN 5-325 MG PO TABS
1.0000 | ORAL_TABLET | Freq: Once | ORAL | Status: AC
  Administered 2020-09-08: 1 via ORAL
  Filled 2020-09-08: qty 1

## 2020-09-08 MED ORDER — KETOROLAC TROMETHAMINE 30 MG/ML IJ SOLN
30.0000 mg | Freq: Once | INTRAMUSCULAR | Status: AC
  Administered 2020-09-08: 30 mg via INTRAMUSCULAR
  Filled 2020-09-08: qty 1

## 2020-09-08 MED ORDER — HYDROCODONE-ACETAMINOPHEN 5-325 MG PO TABS: 1.0000 | ORAL_TABLET | Freq: Four times a day (QID) | ORAL | 0 refills | Status: DC | PRN

## 2020-09-08 NOTE — ED Notes (Signed)
See triage note   Presents with lower back pain since yesterday  States pain is on right side and moves into right leg  Thinks she may have picked up something heavy the wrong way  Ambulates with slight limp d/t pain

## 2020-09-08 NOTE — ED Triage Notes (Signed)
Pt states that her lower right side of her back began to hurt after getting off of work last night. It shoots down her right leg. 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 Denies any GU symptoms

## 2020-09-08 NOTE — Discharge Instructions (Signed)
Follow-up with your primary care provider if any continued problems or concerns.  You may use ice or heat to your back as needed for discomfort.  Discontinue taking the over-the-counter medications.  You can continue using the patch that you have on your back for discomfort.  Medication was sent to your pharmacy.  The hydrocodone and methocarbamol can cause drowsiness and increase your risk for injury do not drive or operate machinery while taking this medication.  Naproxen 500 mg twice daily with food.  If not improving you will need to follow-up with your primary care provider.

## 2020-09-08 NOTE — ED Provider Notes (Signed)
[facility] Emergency Department Provider Note   ____________________________________________   Event Date/Time   First MD Initiated Contact with Patient 09/08/20 [phone]     (approximate)  I have reviewed the triage vital signs and the nursing notes.   HISTORY  Chief Complaint Back Pain   HPI Linda Horn is a 55 y.o. female presents to the ED with complaint of right low back pain with radiation to her right lower extremity that began last evening.  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  She denies any previous back problems.  She denies any urinary symptoms.  Patient states she has taken over-the-counter medication such as acetaminophen and ibuprofen without any relief.  Pain is increased with ambulation and movement.  She rates her pain as 10/10.       Past Medical History:  Diagnosis Date  . Anxiety   . Diabetes mellitus, type 2 ([place])   . Hypertension     Patient Active Problem List   Diagnosis Date Noted  . Special screening for malignant neoplasms, colon   . Benign neoplasm of transverse colon   . Polyp of sigmoid colon   . Benign hypertension 07/26/2009  . Acute situational disturbance 07/26/2009    Past Surgical History:  Procedure Laterality Date  . COLONOSCOPY WITH PROPOFOL N/A 05/09/2017   Procedure: COLONOSCOPY WITH PROPOFOL;  Surgeon: Lucilla Lame, MD;  Location: [place];  Service: Endoscopy;  Laterality: N/A;  Diabetes - oral meds  . NO PAST SURGERIES    . POLYPECTOMY  05/09/2017   Procedure: POLYPECTOMY;  Surgeon: Lucilla Lame, MD;  Location: [place];  Service: Endoscopy;;    Prior to Admission medications   Medication Sig Start Date End Date Taking? Authorizing Provider  HYDROcodone-acetaminophen (NORCO/VICODIN) 5-325 MG tablet Take 1 tablet by mouth every 6 (six) hours as needed for moderate pain. 09/08/20 09/08/21 Yes Johnn Hai, PA-C   methocarbamol (ROBAXIN) 500 MG tablet 1-2 every 6 hours prn muscle spasms 09/08/20  Yes Letitia Neri L, PA-C  naproxen (NAPROSYN) 500 MG tablet Take 1 tablet (500 mg total) by mouth 2 (two) times daily with a meal. 09/08/20  Yes Letitia Neri L, PA-C  escitalopram (LEXAPRO) 20 MG tablet Take 1 tablet (20 mg total) by mouth daily. Pt needs to schedule an office visit. 08/28/15   Margarita Rana, MD  hydrochlorothiazide (HYDRODIURIL) 25 MG tablet Take 1 tablet (25 mg total) by mouth daily. Pt needs to schedule an office visit. 08/21/15   Margarita Rana, MD  lovastatin (MEVACOR) 10 MG tablet Take 10 mg by mouth at bedtime.    [provider]  metFORMIN (GLUCOPHAGE) 500 MG tablet Take 1,000 mg by mouth 2 (two) times daily with a meal.    [provider]    Allergies Patient has no known allergies.  Family History  Problem Relation Age of Onset  . Lung cancer Mother   . Diabetes Mellitus II Father   . Heart disease Father   . Lung cancer Father   . Diabetes Mellitus II Sister   . Diabetes Mellitus II Brother   . Diabetes Mellitus II Maternal Grandmother   . Lymphoma Maternal Aunt   . Heart disease Maternal Aunt     Social History Social History   Tobacco Use  . Smoking status: Current Every Day Smoker    Packs/day: 1.50    Years: 30.00    Pack years: 45.00  Types: Cigarettes  . Smokeless tobacco: Never Used  Vaping Use  . Vaping Use: Never used  Substance Use Topics  . Alcohol use: No  . Drug use: No    Review of Systems Constitutional: No fever/chills Cardiovascular: Denies chest pain. Respiratory: Denies shortness of breath. Gastrointestinal: No abdominal pain.  No nausea, no vomiting.  Genitourinary: Negative for dysuria. Musculoskeletal: Positive right lower back pain.  Positive for right leg radiculopathy. Skin: Negative for rash. Neurological: Negative for headaches, focal weakness or  numbness. ____________________________________________   PHYSICAL EXAM:  VITAL SIGNS: ED Triage Vitals [09/08/20 0647]  Enc Vitals Group     BP (!) 145/69     Pulse Rate 65     Resp 18     Temp 98.4 F (36.9 C)     Temp Source Oral     SpO2 98 %     Weight 163 lb 12.8 oz (74.3 kg)     Height 5\' 2"  (1.575 m)     Head Circumference      Peak Flow      Pain Score 10     Pain Loc      Pain Edu?      Excl. in [place]?     Constitutional: Alert and oriented. Well appearing and in no acute distress. Eyes: Conjunctivae are normal.  Head: Atraumatic. Neck: No stridor.   Cardiovascular: Normal rate, regular rhythm. Grossly normal heart sounds.  Good peripheral circulation. Respiratory: Normal respiratory effort.  No retractions. Lungs CTAB. Gastrointestinal: Soft and nontender. No distention.  Musculoskeletal: No point tenderness on palpation of the thoracic or lumbar spine.  There is moderate tenderness on the right SI joint area and surrounding tissue.  Patient is able to get from a seating to standing position slowly and is guarded with range of motion.  Slow gait. Neurologic:  Normal speech and language.  Reflexes 2+ bilaterally lower extremities.  No gross focal neurologic deficits are appreciated. No gait instability. Skin:  Skin is warm, dry and intact. No rash noted. Psychiatric: Mood and affect are normal. Speech and behavior are normal.  ____________________________________________   LABS (all labs ordered are listed, but only abnormal results are displayed)  Labs Reviewed - No data to display    PROCEDURES  Procedure(s) performed (including Critical Care):  Procedures   ____________________________________________   INITIAL IMPRESSION / ASSESSMENT AND PLAN / ED COURSE  As part of my medical decision making, I reviewed the following data within the electronic MEDICAL RECORD NUMBER Notes from prior ED visits and [place] Controlled Substance Database  55 year old female  presents to the ED with complaint of right-sided low back pain with radiculopathy in her right lower extremity.  Patient denies any injury.  On exam there is no point tenderness on palpation of the lumbar spine however moderate tenderness is noted on the SI joint and surrounding tissue.  Good muscle strength 5/5.  Patient was given Toradol 30 mg IM, methocarbamol 1000 mg p.o. and hydrocodone 1 tablet p.o. and began improving.  X-rays were deferred at this time due to no actual injury.  History and physical exam is more consistent with sciatica and patient was made aware.  She is to follow-up with her PCP if any continued problems or Dr. Sabra Heck who is on-call for orthopedics.  A prescription for Robaxin, naproxen and hydrocodone was sent to her pharmacy.  She is encouraged to use ice or heat to her back as needed for discomfort.  ____________________________________________   FINAL CLINICAL IMPRESSION(S) /  ED DIAGNOSES  Final diagnoses:  Acute right-sided low back pain with right-sided sciatica     ED Discharge Orders         Ordered    HYDROcodone-acetaminophen (NORCO/VICODIN) 5-325 MG tablet  Every 6 hours PRN        09/08/20 0756    methocarbamol (ROBAXIN) 500 MG tablet        09/08/20 0758    naproxen (NAPROSYN) 500 MG tablet  2 times daily with meals        09/08/20 0758           Note:  This document was prepared using Dragon voice recognition software and may include unintentional dictation errors.    Johnn Hai, PA-C 09/08/20 1218    Blake Divine, MD 09/08/20 [phone]

## 2020-09-12 ENCOUNTER — Telehealth: Payer: Self-pay | Admitting: *Deleted

## 2020-09-12 NOTE — Telephone Encounter (Signed)
Received referral for low dose lung cancer screening CT scan. Message left at phone number listed in EMR for patient to call me back to facilitate scheduling scan.  

## 2020-09-20 ENCOUNTER — Other Ambulatory Visit: Payer: Self-pay | Admitting: Urology

## 2020-09-20 DIAGNOSIS — R3129 Other microscopic hematuria: Secondary | ICD-10-CM

## 2020-09-21 ENCOUNTER — Telehealth: Payer: Self-pay | Admitting: *Deleted

## 2020-09-21 DIAGNOSIS — Z122 Encounter for screening for malignant neoplasm of respiratory organs: Secondary | ICD-10-CM

## 2020-09-21 DIAGNOSIS — F17200 Nicotine dependence, unspecified, uncomplicated: Secondary | ICD-10-CM

## 2020-09-21 DIAGNOSIS — Z87891 Personal history of nicotine dependence: Secondary | ICD-10-CM

## 2020-09-21 NOTE — Telephone Encounter (Signed)
Received referral for initial lung cancer screening scan. Contacted patient and obtained smoking history,(current smoker, 1.5 ppd x 30 yrs) as well as answering questions related to screening process. Patient denies signs of lung cancer such as weight loss or hemoptysis. Patient denies comorbidity that would prevent curative treatment if lung cancer were found. Patient is scheduled for shared decision making visit and CT scan on 10/10/20@1 :15 pm.

## 2020-09-29 ENCOUNTER — Ambulatory Visit
Admission: RE | Admit: 2020-09-29 | Discharge: 2020-09-29 | Disposition: A | Discharge status: Home / Self Care | Payer: No Typology Code available for payment source | Source: Ambulatory Visit | Attending: Urology | Admitting: Urology

## 2020-09-29 ENCOUNTER — Other Ambulatory Visit: Payer: Self-pay

## 2020-09-29 DIAGNOSIS — R3129 Other microscopic hematuria: Secondary | ICD-10-CM | POA: Insufficient documentation

## 2020-09-29 LAB — POCT I-STAT CREATININE: Creatinine, Ser: 0.6 mg/dL (ref 0.44–1.00)

## 2020-09-29 MED ORDER — IOHEXOL 300 MG/ML  SOLN
125.0000 mL | Freq: Once | INTRAMUSCULAR | Status: AC | PRN
  Administered 2020-09-29: 125 mL via INTRAVENOUS

## 2020-10-10 ENCOUNTER — Ambulatory Visit: Admission: RE | Admit: 2020-10-10 | Payer: Self-pay | Source: Ambulatory Visit

## 2020-10-10 ENCOUNTER — Inpatient Hospital Stay: Payer: No Typology Code available for payment source | Admitting: Hospice and Palliative Medicine

## 2021-02-07 ENCOUNTER — Other Ambulatory Visit: Payer: Self-pay

## 2021-02-07 ENCOUNTER — Emergency Department
Admission: EM | Admit: 2021-02-07 | Discharge: 2021-02-07 | Disposition: A | Discharge status: Home / Self Care | Payer: No Typology Code available for payment source | Attending: Emergency Medicine | Admitting: Emergency Medicine

## 2021-02-07 DIAGNOSIS — M533 Sacrococcygeal disorders, not elsewhere classified: Secondary | ICD-10-CM | POA: Diagnosis not present

## 2021-02-07 DIAGNOSIS — E119 Type 2 diabetes mellitus without complications: Secondary | ICD-10-CM | POA: Insufficient documentation

## 2021-02-07 DIAGNOSIS — F17210 Nicotine dependence, cigarettes, uncomplicated: Secondary | ICD-10-CM | POA: Diagnosis not present

## 2021-02-07 DIAGNOSIS — I10 Essential (primary) hypertension: Secondary | ICD-10-CM | POA: Insufficient documentation

## 2021-02-07 DIAGNOSIS — Z7984 Long term (current) use of oral hypoglycemic drugs: Secondary | ICD-10-CM | POA: Diagnosis not present

## 2021-02-07 DIAGNOSIS — M545 Low back pain, unspecified: Secondary | ICD-10-CM | POA: Diagnosis present

## 2021-02-07 DIAGNOSIS — Z86018 Personal history of other benign neoplasm: Secondary | ICD-10-CM | POA: Diagnosis not present

## 2021-02-07 DIAGNOSIS — Z79899 Other long term (current) drug therapy: Secondary | ICD-10-CM | POA: Insufficient documentation

## 2021-02-07 MED ORDER — METHYLPREDNISOLONE 4 MG PO TBPK: ORAL_TABLET | ORAL | 0 refills | Status: AC

## 2021-02-07 MED ORDER — HYDROCODONE-ACETAMINOPHEN 5-325 MG PO TABS: 1.0000 | ORAL_TABLET | Freq: Four times a day (QID) | ORAL | 0 refills | Status: DC | PRN

## 2021-02-07 MED ORDER — LIDOCAINE 5 % EX PTCH
1.0000 | MEDICATED_PATCH | CUTANEOUS | Status: DC
  Administered 2021-02-07: 1 via TRANSDERMAL
  Filled 2021-02-07: qty 1

## 2021-02-07 MED ORDER — KETOROLAC TROMETHAMINE 30 MG/ML IJ SOLN
30.0000 mg | Freq: Once | INTRAMUSCULAR | Status: AC
  Administered 2021-02-07: 30 mg via INTRAMUSCULAR
  Filled 2021-02-07: qty 1

## 2021-02-07 MED ORDER — BACLOFEN 10 MG PO TABS: 10.0000 mg | ORAL_TABLET | Freq: Three times a day (TID) | ORAL | 0 refills | Status: AC

## 2021-02-07 NOTE — ED Provider Notes (Signed)
[facility] Emergency Department Provider Note  ____________________________________________   Event Date/Time   First MD Initiated Contact with Patient 02/07/21 [phone]     (approximate)  I have reviewed the triage vital signs and the nursing notes.   HISTORY  Chief Complaint Back Pain    HPI Linda Horn is a 55 y.o. female  C/o low back pain for 2 day, no known injury, pain is worse with movement, increased with bending over, denies numbness, tingling, or changes in bowel/urinary habits, pain located at the right SI joint.  Using otc meds without relief Remainder ros neg   Past Medical History:  Diagnosis Date   Anxiety    Diabetes mellitus, type 2 ([place])    Hypertension     Patient Active Problem List   Diagnosis Date Noted   Special screening for malignant neoplasms, colon    Benign neoplasm of transverse colon    Polyp of sigmoid colon    Benign hypertension 07/26/2009   Acute situational disturbance 07/26/2009    Past Surgical History:  Procedure Laterality Date   COLONOSCOPY WITH PROPOFOL N/A 05/09/2017   Procedure: COLONOSCOPY WITH PROPOFOL;  Surgeon: Lucilla Lame, MD;  Location: [place];  Service: Endoscopy;  Laterality: N/A;  Diabetes - oral meds   NO PAST SURGERIES     POLYPECTOMY  05/09/2017   Procedure: POLYPECTOMY;  Surgeon: Lucilla Lame, MD;  Location: [place];  Service: Endoscopy;;    Prior to Admission medications   Medication Sig Start Date End Date Taking? Authorizing Provider  baclofen (LIORESAL) 10 MG tablet Take 1 tablet (10 mg total) by mouth 3 (three) times daily for 7 days. 02/07/21 02/14/21 Yes Duward Allbritton, Linden Dolin, PA-C  HYDROcodone-acetaminophen (NORCO/VICODIN) 5-325 MG tablet Take 1 tablet by mouth every 6 (six) hours as needed for moderate pain. 02/07/21  Yes Salman Wellen, Linden Dolin, PA-C  methylPREDNISolone (MEDROL DOSEPAK) 4 MG TBPK tablet Take 6 pills on day one then decrease by 1 pill each day  02/07/21  Yes Nicolis Boody, Linden Dolin, PA-C  escitalopram (LEXAPRO) 20 MG tablet Take 1 tablet (20 mg total) by mouth daily. Pt needs to schedule an office visit. 08/28/15   Margarita Rana, MD  hydrochlorothiazide (HYDRODIURIL) 25 MG tablet Take 1 tablet (25 mg total) by mouth daily. Pt needs to schedule an office visit. 08/21/15   Margarita Rana, MD  lovastatin (MEVACOR) 10 MG tablet Take 10 mg by mouth at bedtime.    [provider]  metFORMIN (GLUCOPHAGE) 500 MG tablet Take 1,000 mg by mouth 2 (two) times daily with a meal.    [provider]    Allergies Patient has no known allergies.  Family History  Problem Relation Age of Onset   Lung cancer Mother    Diabetes Mellitus II Father    Heart disease Father    Lung cancer Father    Diabetes Mellitus II Sister    Diabetes Mellitus II Brother    Diabetes Mellitus II Maternal Grandmother    Lymphoma Maternal Aunt    Heart disease Maternal Aunt     Social History Social History   Tobacco Use   Smoking status: Every Day    Packs/day: 1.50    Years: 30.00    Pack years: 45.00    Types: Cigarettes   Smokeless tobacco: Never  Vaping Use   Vaping Use: Never used  Substance Use Topics   Alcohol use: No   Drug use: No    Review of  Systems  Constitutional: No fever/chills Eyes: No visual changes. ENT: No sore throat. Respiratory: Denies cough Genitourinary: Negative for dysuria. Musculoskeletal: Positive for back pain. Skin: Negative for rash.    ____________________________________________   PHYSICAL EXAM:  VITAL SIGNS: ED Triage Vitals  Enc Vitals Group     BP 02/07/21 0908 (!) 146/67     Pulse Rate 02/07/21 0908 72     Resp 02/07/21 0908 16     Temp 02/07/21 0908 98 F (36.7 C)     Temp src --      SpO2 02/07/21 0908 97 %     Weight 02/07/21 0909 157 lb (71.2 kg)     Height 02/07/21 0909 5\' 1"  (1.549 m)     Head Circumference --      Peak Flow --      Pain Score 02/07/21 0908 10     Pain Loc --       Pain Edu? --      Excl. in [place]? --     Constitutional: Alert and oriented. Well appearing and in no acute distress. Eyes: Conjunctivae are normal.  Head: Atraumatic. Nose: No congestion/rhinnorhea. Mouth/Throat: Mucous membranes are moist.   Neck:  supple no lymphadenopathy noted Cardiovascular: Normal rate, regular rhythm. Heart sounds are normal Respiratory: Normal respiratory effort.  No retractions, lungs c t a  GU: deferred Musculoskeletal: FROM all extremities, warm and well perfused.  Decreased rom of back due to discomfort, lumbar spine nontender, right SI joints tender, negative slr, 5/5 strength in great toes b/l, however strength in lower legs, n/v intact Neurologic:  Normal speech and language.  Skin:  Skin is warm, dry and intact. No rash noted. Psychiatric: Mood and affect are normal. Speech and behavior are normal.  ____________________________________________   LABS (all labs ordered are listed, but only abnormal results are displayed)  Labs Reviewed - No data to display ____________________________________________   ____________________________________________  RADIOLOGY    ____________________________________________   PROCEDURES  Procedure(s) performed: Toradol 30 mg IM   Procedures    ____________________________________________   INITIAL IMPRESSION / ASSESSMENT AND PLAN / ED COURSE  Pertinent labs & imaging results that were available during my care of the patient were reviewed by me and considered in my medical decision making (see chart for details).   Patient is a 55 year old female presents emergency department low back pain.  See HPI.  Physical exam shows patient appears stable.  Exam is consistent with inflammation of the SI joint.  I did explain all the findings to the patient.  I encouraged her to follow-up with orthopedics or physical therapy.  We did go over maneuvers for stretching and decreasing inflammation in this area.  She  will be placed on a Medrol Dosepak, muscle relaxer, pain medication as needed.  Work note was also provided.  She is given Toradol 30 mg IM while here in the ED and discharged in stable condition.     As part of my medical decision making, I reviewed the following data within the [place] notes reviewed and incorporated, Old chart reviewed, Notes from prior ED visits, and [place] Controlled Substance Database  ____________________________________________   FINAL CLINICAL IMPRESSION(S) / ED DIAGNOSES  Final diagnoses:  Pain of right sacroiliac joint      NEW MEDICATIONS STARTED DURING THIS VISIT:  New Prescriptions   BACLOFEN (LIORESAL) 10 MG TABLET    Take 1 tablet (10 mg total) by mouth 3 (three) times daily for 7 days.  HYDROCODONE-ACETAMINOPHEN (NORCO/VICODIN) 5-325 MG TABLET    Take 1 tablet by mouth every 6 (six) hours as needed for moderate pain.   METHYLPREDNISOLONE (MEDROL DOSEPAK) 4 MG TBPK TABLET    Take 6 pills on day one then decrease by 1 pill each day     Note:  This document was prepared using Dragon voice recognition software and may include unintentional dictation errors.     Versie Starks, PA-C 02/07/21 1012    Naaman Plummer, MD 02/07/21 1434

## 2021-02-07 NOTE — ED Triage Notes (Signed)
Pt c/o right lower back pain for over a month, denies injury.

## 2021-02-07 NOTE — Discharge Instructions (Signed)
Follow-up with your regular doctor if not improving in 2 to 3 days.  Return emergency department for worsening.  Follow-up with orthopedics as she may need physical therapy or steroid injection. Take your medications as prescribed.

## 2021-02-13 ENCOUNTER — Inpatient Hospital Stay: Payer: No Typology Code available for payment source | Attending: Oncology | Admitting: Oncology

## 2021-02-13 ENCOUNTER — Inpatient Hospital Stay: Payer: No Typology Code available for payment source

## 2022-01-09 ENCOUNTER — Other Ambulatory Visit: Payer: Self-pay | Admitting: Family Medicine

## 2022-01-09 DIAGNOSIS — M5416 Radiculopathy, lumbar region: Secondary | ICD-10-CM

## 2022-05-31 ENCOUNTER — Ambulatory Visit
Admission: RE | Admit: 2022-05-31 | Discharge: 2022-05-31 | Disposition: A | Discharge status: Home / Self Care | Payer: No Typology Code available for payment source | Source: Ambulatory Visit | Attending: Student | Admitting: Student

## 2022-05-31 ENCOUNTER — Other Ambulatory Visit: Payer: Self-pay | Admitting: Student

## 2022-05-31 DIAGNOSIS — M25511 Pain in right shoulder: Secondary | ICD-10-CM | POA: Insufficient documentation

## 2022-05-31 DIAGNOSIS — M542 Cervicalgia: Secondary | ICD-10-CM | POA: Diagnosis not present

## 2022-09-21 ENCOUNTER — Emergency Department: Payer: 59

## 2022-09-21 ENCOUNTER — Encounter: Payer: Self-pay | Admitting: Intensive Care

## 2022-09-21 ENCOUNTER — Other Ambulatory Visit: Payer: Self-pay

## 2022-09-21 ENCOUNTER — Emergency Department
Admission: EM | Admit: 2022-09-21 | Discharge: 2022-09-21 | Disposition: A | Discharge status: Home / Self Care | Payer: 59 | Attending: Emergency Medicine | Admitting: Emergency Medicine

## 2022-09-21 DIAGNOSIS — I10 Essential (primary) hypertension: Secondary | ICD-10-CM | POA: Diagnosis not present

## 2022-09-21 DIAGNOSIS — S62307A Unspecified fracture of fifth metacarpal bone, left hand, initial encounter for closed fracture: Secondary | ICD-10-CM | POA: Insufficient documentation

## 2022-09-21 DIAGNOSIS — E119 Type 2 diabetes mellitus without complications: Secondary | ICD-10-CM | POA: Insufficient documentation

## 2022-09-21 DIAGNOSIS — W010XXA Fall on same level from slipping, tripping and stumbling without subsequent striking against object, initial encounter: Secondary | ICD-10-CM | POA: Insufficient documentation

## 2022-09-21 DIAGNOSIS — S6982XA Other specified injuries of left wrist, hand and finger(s), initial encounter: Secondary | ICD-10-CM | POA: Diagnosis not present

## 2022-09-21 DIAGNOSIS — M7989 Other specified soft tissue disorders: Secondary | ICD-10-CM | POA: Diagnosis not present

## 2022-09-21 HISTORY — DX: Depression, unspecified: F32.A

## 2022-09-21 NOTE — Discharge Instructions (Signed)
You have a fracture of the hand.  Your fracture is located at the base of the fifth metacarpal.  You will need to see orthopedics.  Apply ice and elevate the hand is much as possible to decrease swelling.

## 2022-09-21 NOTE — ED Triage Notes (Signed)
Patient reports mechanical fall yesterday and injuring left hand. Hand is swollen and painful

## 2022-09-21 NOTE — ED Provider Notes (Signed)
[facility] Provider Note    Event Date/Time   First MD Initiated Contact with Patient 09/21/22 0813     (approximate)   History   Hand Pain   HPI  Linda Horn is a 57 y.o. female with history of hypertension, diabetes presents emergency department with a left hand injury.  Patient is right-handed.  Patient states yesterday she was helping her brother with some work and tripped over a stump landing on the left hand underneath her and bent it back.  Complains of pain and swelling today.  No numbness or tingling.  No other injuries reported      Physical Exam   Triage Vital Signs: ED Triage Vitals  Enc Vitals Group     BP 09/21/22 0805 (!) 183/76     Pulse Rate 09/21/22 0805 74     Resp 09/21/22 0805 15     Temp 09/21/22 0805 97.7 F (36.5 C)     Temp Source 09/21/22 0805 Oral     SpO2 09/21/22 0805 96 %     Weight 09/21/22 0803 160 lb (72.6 kg)     Height 09/21/22 0803 5\' 1"  (1.549 m)     Head Circumference --      Peak Flow --      Pain Score 09/21/22 0803 10     Pain Loc --      Pain Edu? --      Excl. in [place]? --     Most recent vital signs: Vitals:   09/21/22 0805  BP: (!) 183/76  Pulse: 74  Resp: 15  Temp: 97.7 F (36.5 C)  SpO2: 96%     General: Awake, no distress.   CV:  Good peripheral perfusion. regular rate and  rhythm Resp:  Normal effort.  Abd:  No distention.   Other:  Left hand is swollen and tender, more tender along the fourth and fifth metacarpals proximally, wrist is not tender at the distal radius and ulna, elbow is nontender   ED Results / Procedures / Treatments   Labs (all labs ordered are listed, but only abnormal results are displayed) Labs Reviewed - No data to display   EKG     RADIOLOGY X-ray of the left hand    PROCEDURES:   .Ortho Injury Treatment  Date/Time: 09/21/2022 9:09 AM  Performed by: Faythe Ghee, PA-C Authorized by: Faythe Ghee, PA-C   Consent:    Consent  obtained:  Verbal   Consent given by:  Patient   Risks discussed:  Fracture, nerve damage, restricted joint movement, vascular damage, stiffness, recurrent dislocation and irreducible dislocation   Alternatives discussed:  No treatmentInjury location: hand Location details: left hand Injury type: fracture Fracture type: fifth metacarpal Pre-procedure neurovascular assessment: neurovascularly intact Pre-procedure distal perfusion: normal Pre-procedure neurological function: normal Pre-procedure range of motion: normal  Anesthesia: Local anesthesia used: no  Patient sedated: NoManipulation performed: no Immobilization: splint Splint type: ulnar gutter Supplies used: cotton padding, elastic bandage and Ortho-Glass Post-procedure neurovascular assessment: post-procedure neurovascularly intact Post-procedure distal perfusion: normal Post-procedure neurological function: normal Post-procedure range of motion: normal      MEDICATIONS ORDERED IN ED: Medications - No data to display   IMPRESSION / MDM / ASSESSMENT AND PLAN / ED COURSE  I reviewed the triage vital signs and the nursing notes.  Differential diagnosis includes, but is not limited to, fracture, contusion, sprain  Patient's presentation is most consistent with acute complicated illness / injury requiring diagnostic workup.   X-ray of the left hand   I did independently review review and interpret the x-ray of the left hand.  There is a fracture at the base of the fifth metacarpal.  Confirmed by radiology.  I did explain all of these findings to the patient.  I did offer pain medication but patient states she just wants to take over-the-counter medication.  She was placed in a ulnar gutter OCL.  She is to follow-up with her regular doctor as needed.  Follow-up with orthopedics by calling make an appointment on Monday.  She is in agreement treatment plan.  Was discharged stable  condition.   FINAL CLINICAL IMPRESSION(S) / ED DIAGNOSES   Final diagnoses:  Unspecified fracture of fifth metacarpal bone, left hand, initial encounter for closed fracture     Rx / DC Orders   ED Discharge Orders     None        Note:  This document was prepared using Dragon voice recognition software and may include unintentional dictation errors.    Faythe Ghee, PA-C 09/21/22 8295    Corena Herter, MD 09/21/22 [phone]

## 2022-09-23 ENCOUNTER — Telehealth: Payer: Self-pay

## 2022-09-23 NOTE — Transitions of Care (Post Inpatient/ED Visit) (Signed)
09/23/2022  Name: NYERA RESA MRN: [id number] DOB: 1965-12-27  Today's TOC FU Call Status: Today's TOC FU Call Status:: Successful TOC FU Call Competed TOC FU Call Complete Date: 09/23/22  Red on EMMI-ED Discharge Alert Date & Reason:09/21/22 "Scheduled follow-up appt? No"  Transition Care Management Follow-up Telephone Call Date of Discharge: 09/22/22 Discharge Facility: [facility]) Type of Discharge: Emergency Department Reason for ED Visit: Orthopedic Conditions Orthopedic/Injury Diagnosis: Fracture ('unspecified fracture of 5th metacarpal bone,left hand") How have you been since you were released from the hospital?: Same (Pt reports that she continues to have pain to her hand-only taking Tylenol about 2x/day-QAM and PM. She is using ice therapy & keeping it elevated) Any questions or concerns?: Yes Patient Questions/Concerns:: During ED visit pt declined script for pain emds-sttes she nwo wants to try somethng other than OTC Tylenol to take Patient Questions/Concerns Addressed: Other: (Pain mgmt discussed with pt-she will contact provider/PCP to discuss)  Items Reviewed: Did you receive and understand the discharge instructions provided?: Yes Medications obtained,verified, and reconciled?: Partial Review Completed Reason for Partial Mediation Review: pt currently not at home with meds Any new allergies since your discharge?: No Dietary orders reviewed?: NA Do you have support at home?: Yes People in Home: spouse Name of Support/Comfort Primary Source: Timothy  Medications Reviewed Today: Medications Reviewed Today     Reviewed by Charlyn Minerva, RN (Registered Nurse) on 09/23/22 at 1402  Med List Status: <None>   Medication Order Taking? Sig Documenting Provider Last Dose Status Informant  ALPRAZolam (XANAX) 0.25 MG tablet [id number]  Take 0.25 mg by mouth daily. [provider]  Active   escitalopram (LEXAPRO) 20 MG  tablet [id number]  Take 1 tablet (20 mg total) by mouth daily. Pt needs to schedule an office visit. Lorie Phenix, MD  Active   hydrochlorothiazide (HYDRODIURIL) 25 MG tablet [id number]  Take 1 tablet (25 mg total) by mouth daily. Pt needs to schedule an office visit. Lorie Phenix, MD  Active   HYDROcodone-acetaminophen (NORCO/VICODIN) 5-325 MG tablet [id number] No Take 1 tablet by mouth every 6 (six) hours as needed for moderate pain.  Patient not taking: Reported on 09/23/2022   Faythe Ghee, PA-C Not Taking Active   levofloxacin (LEVAQUIN) 500 MG tablet [id number] No PLEASE SEE ATTACHED FOR DETAILED DIRECTIONS  Patient not taking: Reported on 09/23/2022   [provider] Not Taking Active   lisinopril (ZESTRIL) 10 MG tablet [id number]  Take 10 mg by mouth daily. [provider]  Active   lovastatin (MEVACOR) 10 MG tablet [id number]  Take 10 mg by mouth at bedtime. [provider]  Active   lovastatin (MEVACOR) 40 MG tablet [id number]  Take 40 mg by mouth daily. [provider]  Active   meloxicam (MOBIC) 15 MG tablet [id number]  Take 15 mg by mouth daily. [provider]  Active   metFORMIN (GLUCOPHAGE) 500 MG tablet [id number]  Take 1,000 mg by mouth 2 (two) times daily with a meal. [provider]  Active   methylPREDNISolone (MEDROL DOSEPAK) 4 MG TBPK tablet [id number]  Take 6 pills on day one then decrease by 1 pill each day Faythe Ghee, PA-C  Active   predniSONE (DELTASONE) 10 MG tablet [id number]  Take by mouth. [provider]  Active             Home Care and Equipment/Supplies: Were Home Health Services Ordered?: NA Any new equipment or medical supplies ordered?: NA  Functional  Questionnaire: Do you need assistance with bathing/showering or dressing?: No Do you need assistance with meal preparation?: No Do you need assistance with eating?: No Do you have difficulty maintaining continence: No Do you need assistance  with getting out of bed/getting out of a chair/moving?: No Do you have difficulty managing or taking your medications?: No  Follow up appointments reviewed: PCP Follow-up appointment confirmed?: No (Pt reports she is followed by PCP-Brooke-unsure of last name and office name-has info at home-will call office and make an appt) MD Provider Line Number:[phone] Given: No Specialist Hospital Follow-up appointment confirmed?: No (Pt reports she called ortho office this morning-they will be calling her back with appt info) Reason Specialist Follow-Up Not Confirmed: Patient has Specialist Provider Number and will Call for Appointment Do you need transportation to your follow-up appointment?: No Do you understand care options if your condition(s) worsen?: Yes-patient verbalized understanding   TOC Interventions Today    Flowsheet Row Most Recent Value  TOC Interventions   TOC Interventions Discussed/Reviewed TOC Interventions Discussed, Post discharge activity limitations per provider      Interventions Today    Flowsheet Row Most Recent Value  General Interventions   General Interventions Discussed/Reviewed General Interventions Discussed, Doctor Visits  Doctor Visits Discussed/Reviewed Doctor Visits Discussed, Specialist, PCP  PCP/Specialist Visits Compliance with follow-up visit  Education Interventions   Education Provided Provided Education  Provided Verbal Education On When to see the doctor, Medication, Other  [pain mgmt]  Pharmacy Interventions   Pharmacy Dicussed/Reviewed Pharmacy Topics Discussed, Medications and their functions       Alessandra Grout [facility] Care Management Care Management Community Coordinator Direct Phone: [phone] Toll Free: [phone] Fax: [phone]

## 2022-09-25 DIAGNOSIS — M25542 Pain in joints of left hand: Secondary | ICD-10-CM | POA: Diagnosis not present

## 2022-09-25 DIAGNOSIS — S62316A Displaced fracture of base of fifth metacarpal bone, right hand, initial encounter for closed fracture: Secondary | ICD-10-CM | POA: Diagnosis not present

## 2022-11-08 DIAGNOSIS — R5383 Other fatigue: Secondary | ICD-10-CM | POA: Diagnosis not present

## 2022-11-08 DIAGNOSIS — R42 Dizziness and giddiness: Secondary | ICD-10-CM | POA: Diagnosis not present

## 2022-11-08 DIAGNOSIS — E119 Type 2 diabetes mellitus without complications: Secondary | ICD-10-CM | POA: Diagnosis not present

## 2022-11-08 DIAGNOSIS — R8290 Unspecified abnormal findings in urine: Secondary | ICD-10-CM | POA: Diagnosis not present

## 2022-11-11 DIAGNOSIS — Z5181 Encounter for therapeutic drug level monitoring: Secondary | ICD-10-CM | POA: Diagnosis not present

## 2022-11-11 DIAGNOSIS — R42 Dizziness and giddiness: Secondary | ICD-10-CM | POA: Diagnosis not present

## 2022-11-11 DIAGNOSIS — J019 Acute sinusitis, unspecified: Secondary | ICD-10-CM | POA: Diagnosis not present

## 2022-11-25 ENCOUNTER — Other Ambulatory Visit: Payer: Self-pay | Admitting: Family

## 2022-11-25 DIAGNOSIS — Z87891 Personal history of nicotine dependence: Secondary | ICD-10-CM

## 2022-12-13 DIAGNOSIS — R059 Cough, unspecified: Secondary | ICD-10-CM | POA: Diagnosis not present

## 2022-12-13 DIAGNOSIS — R519 Headache, unspecified: Secondary | ICD-10-CM | POA: Diagnosis not present

## 2022-12-13 DIAGNOSIS — R509 Fever, unspecified: Secondary | ICD-10-CM | POA: Diagnosis not present

## 2022-12-17 ENCOUNTER — Ambulatory Visit: Payer: 59

## 2023-03-16 ENCOUNTER — Emergency Department
Admission: EM | Admit: 2023-03-16 | Discharge: 2023-03-16 | Disposition: A | Discharge status: Home / Self Care | Payer: 59 | Attending: Emergency Medicine | Admitting: Emergency Medicine

## 2023-03-16 ENCOUNTER — Other Ambulatory Visit: Payer: Self-pay

## 2023-03-16 ENCOUNTER — Encounter: Payer: Self-pay | Admitting: Emergency Medicine

## 2023-03-16 DIAGNOSIS — J069 Acute upper respiratory infection, unspecified: Secondary | ICD-10-CM | POA: Diagnosis not present

## 2023-03-16 DIAGNOSIS — E119 Type 2 diabetes mellitus without complications: Secondary | ICD-10-CM | POA: Insufficient documentation

## 2023-03-16 DIAGNOSIS — B9789 Other viral agents as the cause of diseases classified elsewhere: Secondary | ICD-10-CM | POA: Diagnosis not present

## 2023-03-16 DIAGNOSIS — I10 Essential (primary) hypertension: Secondary | ICD-10-CM | POA: Diagnosis not present

## 2023-03-16 DIAGNOSIS — Z20822 Contact with and (suspected) exposure to covid-19: Secondary | ICD-10-CM | POA: Insufficient documentation

## 2023-03-16 DIAGNOSIS — R059 Cough, unspecified: Secondary | ICD-10-CM | POA: Diagnosis not present

## 2023-03-16 LAB — RESP PANEL BY RT-PCR (RSV, FLU A&B, COVID)  RVPGX2
Influenza A by PCR: NEGATIVE
Influenza B by PCR: NEGATIVE
Resp Syncytial Virus by PCR: NEGATIVE
SARS Coronavirus 2 by RT PCR: NEGATIVE

## 2023-03-16 MED ORDER — BENZONATATE 100 MG PO CAPS: 100.0000 mg | ORAL_CAPSULE | Freq: Three times a day (TID) | ORAL | 0 refills | Status: AC | PRN

## 2023-03-16 NOTE — ED Triage Notes (Signed)
Pt reports Thursday started with flu like sx's. Pt reports productive cough and pain with cough, general bodyaches, fatigue, nasal congestion and chills. Pt reports her husband has been sick as well. Pt states she works at [employer] so has been exposed to a lot but states she took a covid test that was negative.

## 2023-03-16 NOTE — ED Provider Notes (Signed)
[facility] Provider Note    Event Date/Time   First MD Initiated Contact with Patient 03/16/23 0945     (approximate)   History   Cough, Generalized Body Aches, and Nasal Congestion   HPI  Linda Horn is a 57 y.o. female with PMH of HTN, diabetes, anxiety and depression who presents for evaluation of URI symptoms including cough, congestion and generalized bodyaches.  Patient denies fever at home.  She has had a productive cough.  She has been sick since Thursday.  She states that her husband has been sick as well with similar symptoms.  She has not taken any over-the-counter cold medication as she was concerned about interactions with her prescription medications.      Physical Exam   Triage Vital Signs: ED Triage Vitals  Encounter Vitals Group     BP 03/16/23 0828 (!) 154/67     Systolic BP Percentile --      Diastolic BP Percentile --      Pulse Rate 03/16/23 0828 70     Resp 03/16/23 0828 18     Temp 03/16/23 0828 97.9 F (36.6 C)     Temp Source 03/16/23 0828 Oral     SpO2 03/16/23 0828 100 %     Weight 03/16/23 0825 165 lb (74.8 kg)     Height 03/16/23 0825 5\' 1"  (1.549 m)     Head Circumference --      Peak Flow --      Pain Score 03/16/23 0825 5     Pain Loc --      Pain Education --      Exclude from Growth Chart --     Most recent vital signs: Vitals:   03/16/23 0828  BP: (!) 154/67  Pulse: 70  Resp: 18  Temp: 97.9 F (36.6 C)  SpO2: 100%    General: Awake, no distress.  CV:  Good peripheral perfusion.  RRR. Resp:  Normal effort.  CTAB. Abd:  No distention.    ED Results / Procedures / Treatments   Labs (all labs ordered are listed, but only abnormal results are displayed) Labs Reviewed  RESP PANEL BY RT-PCR (RSV, FLU A&B, COVID)  RVPGX2    PROCEDURES:  Critical Care performed: No  Procedures   MEDICATIONS ORDERED IN ED: Medications - No data to display   IMPRESSION / MDM / ASSESSMENT AND PLAN  / ED COURSE  I reviewed the triage vital signs and the nursing notes.                             57 year old female presents for evaluation of URI symptoms including cough, congestion and bodyaches.  Patient was hypertensive in triage but does have history of hypertension, vital signs stable otherwise.  Patient NAD on exam.  Differential diagnosis includes, but is not limited to, COVID, flu, RSV, pneumonia, bronchitis, other viral URI.  Patient's presentation is most consistent with acute, uncomplicated illness.  Patient's respiratory panel was negative for flu, COVID and RSV.  I believe patient has a viral URI.  She was advised on symptomatic management, I recommended Coricidin as she does have hypertension.  I will also send Tessalon Perles.  She was given a note for work.  She voiced understanding, all questions were answered and she was stable at discharge.   FINAL CLINICAL IMPRESSION(S) / ED DIAGNOSES   Final diagnoses:  Viral URI with cough  Rx / DC Orders   ED Discharge Orders          Ordered    benzonatate (TESSALON PERLES) 100 MG capsule  3 times daily PRN        03/16/23 1037             Note:  This document was prepared using Dragon voice recognition software and may include unintentional dictation errors.   Cameron Ali, PA-C 03/16/23 1041    Pilar Jarvis, MD 03/16/23 1321

## 2023-03-16 NOTE — Discharge Instructions (Addendum)
You tested negative for COVID, flu and RSV today.  I believe you have a viral infection, which will resolve on its own with time.   I recommend getting Coricidin HBP, this is a cough medication specifically for people with high blood pressure, it can be bought over-the-counter at any pharmacy.  I have also sent Tessalon Perles which is a cough medication to the pharmacy.  Make sure you are staying well-hydrated and get extra rest if possible.  If you are still having symptoms after 10 days please follow-up with your primary care provider.  If you have any worsening of your symptoms you can return to the ED.

## 2023-03-21 ENCOUNTER — Ambulatory Visit
Admission: RE | Admit: 2023-03-21 | Discharge: 2023-03-21 | Disposition: A | Discharge status: Home / Self Care | Payer: 59 | Source: Ambulatory Visit | Attending: Student | Admitting: Student

## 2023-03-21 ENCOUNTER — Other Ambulatory Visit: Payer: Self-pay | Admitting: Student

## 2023-03-21 DIAGNOSIS — Z1231 Encounter for screening mammogram for malignant neoplasm of breast: Secondary | ICD-10-CM | POA: Diagnosis not present

## 2023-03-21 DIAGNOSIS — R059 Cough, unspecified: Secondary | ICD-10-CM | POA: Diagnosis not present

## 2023-03-31 ENCOUNTER — Other Ambulatory Visit: Payer: Self-pay | Admitting: Student

## 2023-03-31 DIAGNOSIS — R928 Other abnormal and inconclusive findings on diagnostic imaging of breast: Secondary | ICD-10-CM

## 2023-04-11 ENCOUNTER — Ambulatory Visit
Admission: RE | Admit: 2023-04-11 | Discharge: 2023-04-11 | Disposition: A | Discharge status: Home / Self Care | Payer: No Typology Code available for payment source | Source: Ambulatory Visit | Attending: Student

## 2023-04-11 ENCOUNTER — Other Ambulatory Visit: Payer: Self-pay | Admitting: Student

## 2023-04-11 DIAGNOSIS — R928 Other abnormal and inconclusive findings on diagnostic imaging of breast: Secondary | ICD-10-CM

## 2023-04-11 DIAGNOSIS — R921 Mammographic calcification found on diagnostic imaging of breast: Secondary | ICD-10-CM | POA: Diagnosis not present

## 2023-04-15 ENCOUNTER — Ambulatory Visit: Payer: No Typology Code available for payment source

## 2023-04-15 ENCOUNTER — Ambulatory Visit: Payer: 59

## 2023-04-22 ENCOUNTER — Ambulatory Visit: Admission: RE | Admit: 2023-04-22 | Payer: 59 | Source: Ambulatory Visit

## 2023-06-25 ENCOUNTER — Ambulatory Visit
Admission: RE | Admit: 2023-06-25 | Discharge: 2023-06-25 | Disposition: A | Discharge status: Home / Self Care | Source: Ambulatory Visit | Attending: Family | Admitting: Family

## 2023-06-25 DIAGNOSIS — Z87891 Personal history of nicotine dependence: Secondary | ICD-10-CM | POA: Diagnosis present

## 2024-01-19 ENCOUNTER — Other Ambulatory Visit: Payer: Self-pay | Admitting: Nurse Practitioner

## 2024-01-19 DIAGNOSIS — R748 Abnormal levels of other serum enzymes: Secondary | ICD-10-CM

## 2024-01-19 DIAGNOSIS — K760 Fatty (change of) liver, not elsewhere classified: Secondary | ICD-10-CM

## 2024-02-10 ENCOUNTER — Encounter: Payer: Self-pay | Admitting: Emergency Medicine

## 2024-02-10 ENCOUNTER — Emergency Department
Admission: EM | Admit: 2024-02-10 | Discharge: 2024-02-10 | Disposition: A | Discharge status: Home / Self Care | Attending: Emergency Medicine | Admitting: Emergency Medicine

## 2024-02-10 ENCOUNTER — Other Ambulatory Visit: Payer: Self-pay

## 2024-02-10 DIAGNOSIS — B349 Viral infection, unspecified: Secondary | ICD-10-CM | POA: Insufficient documentation

## 2024-02-10 DIAGNOSIS — I10 Essential (primary) hypertension: Secondary | ICD-10-CM | POA: Diagnosis not present

## 2024-02-10 DIAGNOSIS — E119 Type 2 diabetes mellitus without complications: Secondary | ICD-10-CM | POA: Diagnosis not present

## 2024-02-10 DIAGNOSIS — R059 Cough, unspecified: Secondary | ICD-10-CM | POA: Diagnosis present

## 2024-02-10 LAB — RESP PANEL BY RT-PCR (RSV, FLU A&B, COVID)  RVPGX2
Influenza A by PCR: NEGATIVE
Influenza B by PCR: NEGATIVE
Resp Syncytial Virus by PCR: NEGATIVE
SARS Coronavirus 2 by RT PCR: NEGATIVE

## 2024-02-10 NOTE — ED Provider Notes (Signed)
 [facility] Provider Note    Event Date/Time   First MD Initiated Contact with Patient 02/10/24 [id number]     (approximate)   History   Generalized Body Aches   HPI  Linda Horn is a 58 y.o. female with PMH of hypertension, anxiety, diabetes, depression who presents for evaluation of not feeling well in general. She endorses cough, congestion, body aches. No fevers, nausea, vomiting, diarrhea, constipation or urinary symptoms.      Physical Exam   Triage Vital Signs: ED Triage Vitals  Encounter Vitals Group     BP 02/10/24 0906 (!) 154/72     Girls Systolic BP Percentile --      Girls Diastolic BP Percentile --      Boys Systolic BP Percentile --      Boys Diastolic BP Percentile --      Pulse Rate 02/10/24 0906 66     Resp 02/10/24 0906 17     Temp 02/10/24 0906 98 F (36.7 C)     Temp Source 02/10/24 0906 Oral     SpO2 02/10/24 0906 98 %     Weight 02/10/24 0905 165 lb (74.8 kg)     Height 02/10/24 0905 5' 2 (1.575 m)     Head Circumference --      Peak Flow --      Pain Score 02/10/24 0905 7     Pain Loc --      Pain Education --      Exclude from Growth Chart --     Most recent vital signs: Vitals:   02/10/24 0906  BP: (!) 154/72  Pulse: 66  Resp: 17  Temp: 98 F (36.7 C)  SpO2: 98%   General: Awake, no distress.  CV:  Good peripheral perfusion. RRR. Resp:  Normal effort. CTAB. Abd:  No distention.  Other:  Oral mucous membranes are moist, no pharyngeal erythema, no tonsillar enlargement or exudates   ED Results / Procedures / Treatments   Labs (all labs ordered are listed, but only abnormal results are displayed) Labs Reviewed  RESP PANEL BY RT-PCR (RSV, FLU A&B, COVID)  RVPGX2    PROCEDURES:  Critical Care performed: No  Procedures   MEDICATIONS ORDERED IN ED: Medications - No data to display   IMPRESSION / MDM / ASSESSMENT AND PLAN / ED COURSE  I reviewed the triage vital signs and the nursing  notes.                             58 year old female presents for evaluation of general malaise and body aches.  BP is elevated, vital signs stable otherwise.  Patient uncomfortable appearing on exam.  Differential diagnosis includes, but is not limited to, flu, COVID, RSV, other viral infection, pneumonia, bronchitis.  Patient's presentation is most consistent with acute complicated illness / injury requiring diagnostic workup.  Respiratory panel is negative.  Suspicion for pneumonia low given patient has not had any fevers and vital signs are stable today.  Suspect symptoms are due to a viral infection.  Recommended symptomatic treatment using over-the-counter cold medicine. Patient given a note for work. She voiced understanding, all questions answered and she was stable at discharge.       FINAL CLINICAL IMPRESSION(S) / ED DIAGNOSES   Final diagnoses:  Viral illness     Rx / DC Orders   ED Discharge Orders     None  Note:  This document was prepared using Dragon voice recognition software and may include unintentional dictation errors.   Cleaster Tinnie LABOR, PA-C 02/10/24 1032    Dorothyann Drivers, MD 02/10/24 1440

## 2024-02-10 NOTE — Discharge Instructions (Signed)
 You tested negative for flu, covid and rsv today.  You like have another  viral illness which will resolve on its own with time.  You do not need an antibiotic.  You can take over-the-counter cold medicine as needed to manage your symptoms.  If you are taking combination cold medicine keep in mind that this often contains Tylenol  so if you need additional medication for body aches or fever control please take Motrin or ibuprofen.  Your symptoms should resolve with time, if you have had symptoms for greater than 10 days please be evaluated by another healthcare provider as at this point it may have developed into a bacterial infection which requires a different treatment.  Return to the emergency department with worsening symptoms.

## 2024-02-10 NOTE — ED Triage Notes (Signed)
 Patient to ED via POV for generalized body aches and congestion. States ongoing since Sunday. States feeling fatigue. C/o lower back pain.

## 2024-02-10 NOTE — ED Notes (Signed)
 See triage note  Presents with some body aches ,nasal congestion and subjective fever   Afebrile on arrival

## 2024-03-10 ENCOUNTER — Other Ambulatory Visit: Payer: Self-pay

## 2024-03-10 ENCOUNTER — Emergency Department: Admission: EM | Admit: 2024-03-10 | Discharge: 2024-03-10 | Disposition: A | Discharge status: Home / Self Care

## 2024-03-10 ENCOUNTER — Encounter: Payer: Self-pay | Admitting: Emergency Medicine

## 2024-03-10 DIAGNOSIS — I10 Essential (primary) hypertension: Secondary | ICD-10-CM | POA: Diagnosis not present

## 2024-03-10 DIAGNOSIS — R52 Pain, unspecified: Secondary | ICD-10-CM | POA: Diagnosis present

## 2024-03-10 DIAGNOSIS — E119 Type 2 diabetes mellitus without complications: Secondary | ICD-10-CM | POA: Insufficient documentation

## 2024-03-10 LAB — BASIC METABOLIC PANEL WITH GFR
Anion gap: 13 (ref 5–15)
BUN: 12 mg/dL (ref 6–20)
CO2: 25 mmol/L (ref 22–32)
Calcium: 8.6 mg/dL — ABNORMAL LOW (ref 8.9–10.3)
Chloride: 95 mmol/L — ABNORMAL LOW (ref 98–111)
Creatinine, Ser: 0.68 mg/dL (ref 0.44–1.00)
GFR, Estimated: >60 mL/min (ref >60)
Glucose, Bld: 121 mg/dL — ABNORMAL HIGH (ref 70–99)
Potassium: 3.4 mmol/L — ABNORMAL LOW (ref 3.5–5.1)
Sodium: 133 mmol/L — ABNORMAL LOW (ref 135–145)

## 2024-03-10 LAB — CBC
HCT: 43.3 % (ref 36.0–46.0)
Hemoglobin: 14.7 g/dL (ref 12.0–15.0)
MCH: 30.1 pg (ref 26.0–34.0)
MCHC: 33.9 g/dL (ref 30.0–36.0)
MCV: 88.5 fL (ref 80.0–100.0)
Platelets: 289 K/uL (ref 150–400)
RBC: 4.89 MIL/uL (ref 3.87–5.11)
RDW: 12.6 % (ref 11.5–15.5)
WBC: 11.4 K/uL — ABNORMAL HIGH (ref 4.0–10.5)
nRBC: 0 % (ref 0.0–0.2)

## 2024-03-10 LAB — RESP PANEL BY RT-PCR (RSV, FLU A&B, COVID)  RVPGX2
Influenza A by PCR: NEGATIVE
Influenza B by PCR: NEGATIVE
Resp Syncytial Virus by PCR: NEGATIVE
SARS Coronavirus 2 by RT PCR: NEGATIVE

## 2024-03-10 MED ORDER — ONDANSETRON HCL 4 MG/2ML IJ SOLN
4.0000 mg | Freq: Once | INTRAMUSCULAR | Status: AC
  Administered 2024-03-10: 4 mg via INTRAVENOUS
  Filled 2024-03-10: qty 2

## 2024-03-10 MED ORDER — SODIUM CHLORIDE 0.9 % IV BOLUS
500.0000 mL | Freq: Once | INTRAVENOUS | Status: AC
  Administered 2024-03-10: 500 mL via INTRAVENOUS

## 2024-03-10 MED ORDER — ONDANSETRON 4 MG PO TBDP: 4.0000 mg | ORAL_TABLET | Freq: Three times a day (TID) | ORAL | 0 refills | Status: AC | PRN

## 2024-03-10 NOTE — ED Triage Notes (Signed)
 Patient arrives ambulatory by POV c/o generalized body aches, chills and fever onset of today while at work.

## 2024-03-10 NOTE — Discharge Instructions (Signed)
 You were evaluated in the ED for generalized body aches.  Your labs are reassuring.  Your respiratory panel which includes COVID, influenza and RSV is negative.  Please stay hydrated and get plenty of rest.  Follow-up with your primary care provider as needed.

## 2024-03-10 NOTE — ED Provider Notes (Signed)
 [facility] Emergency Department Provider Note     Event Date/Time   First MD Initiated Contact with Patient 03/10/24 1644     (approximate)   History   Generalized Body Aches   HPI  Linda Horn is a 58 y.o. female with a past medical history of anxiety, diabetes type 2, HTN and depression presents to the ED for evaluation of sudden onset of generalized body aches while at work today.  Patient is unable to describe the body aches, but states it hurts all over with associated symptoms of nausea.  Denies fever.  With further discussion with the patient she reports that her symptoms have resolved during my evaluation.  She believes that it is due to depression.  She states 14 years ago on 2025/12/23her son passed away.  Denies SI or HI.     Physical Exam   Triage Vital Signs: ED Triage Vitals  Encounter Vitals Group     BP 03/10/24 1631 (!) 172/63     Girls Systolic BP Percentile --      Girls Diastolic BP Percentile --      Boys Systolic BP Percentile --      Boys Diastolic BP Percentile --      Pulse Rate 03/10/24 1631 96     Resp 03/10/24 1631 17     Temp 03/10/24 1631 98.5 F (36.9 C)     Temp Source 03/10/24 1631 Oral     SpO2 03/10/24 1631 97 %     Weight 03/10/24 1632 165 lb (74.8 kg)     Height 03/10/24 1632 5' 2 (1.575 m)     Head Circumference --      Peak Flow --      Pain Score 03/10/24 1632 8     Pain Loc --      Pain Education --      Exclude from Growth Chart --     Most recent vital signs: Vitals:   03/10/24 1631  BP: (!) 172/63  Pulse: 96  Resp: 17  Temp: 98.5 F (36.9 C)  SpO2: 97%   General: Nontoxic appearing. Alert and oriented. INAD.  Skin:  Warm, dry and intact. No rashes or lesions noted.     Head:  NCAT.  Eyes:  PERRLA. EOMI.  CV:  Good peripheral perfusion. RRR. No peripheral edema.  RESP:  Normal effort. LCTAB. No retractions.  ABD:  No distention. Soft, Non tender.  MSK:   Full ROM in all  joints. No swelling, deformity or tenderness.  NEURO: Cranial nerves II-XII intact. No focal deficits. Speech clear. Sensation and motor function intact. Normal muscle strength of UE & LE. Gait is steady.   ED Results / Procedures / Treatments   Labs (all labs ordered are listed, but only abnormal results are displayed) Labs Reviewed  CBC - Abnormal; Notable for the following components:      Result Value   WBC 11.4 (*)    All other components within normal limits  BASIC METABOLIC PANEL WITH GFR - Abnormal; Notable for the following components:   Sodium 133 (*)    Potassium 3.4 (*)    Chloride 95 (*)    Glucose, Bld 121 (*)    Calcium 8.6 (*)    All other components within normal limits  RESP PANEL BY RT-PCR (RSV, FLU A&B, COVID)  RVPGX2   No results found.  PROCEDURES:  Critical Care performed: No  Procedures   MEDICATIONS ORDERED  IN ED: Medications  sodium chloride  0.9 % bolus 500 mL (0 mLs Intravenous Stopped 03/10/24 1945)  ondansetron (ZOFRAN) injection 4 mg (4 mg Intravenous Given 03/10/24 1823)     IMPRESSION / MDM / ASSESSMENT AND PLAN / ED COURSE  I reviewed the triage vital signs and the nursing notes.                               58 y.o. female presents to the emergency department for evaluation and treatment of generalized bodyaches. See HPI for further details.   Differential diagnosis includes, but is not limited to viral URI, electrolyte abnormality, depression, anxiety  Patient's presentation is most consistent with acute complicated illness / injury requiring diagnostic workup.  Patient is alert and oriented.  She is hemodynamic stable and afebrile.  On general assessment patient is well-appearing.  Physical exam is overall benign.  Plan to obtain basic labs and respiratory panel.  Lab work is reassuring.  BMP shows mildly low sodium, potassium, chloride and calcium.  Glucose 121.  Will administer fluids.  CBC reveals WBC of 11.4 otherwise reassuring.   Respiratory panel is negative.  On reassessment patient remains asymptomatic.  She is ready to go home to eat.  This is reasonable as I do not believe patient will need admission.  She is in stable condition for discharge home.  Advise close follow-up with primary care provider.  ED return precautions discussed.   FINAL CLINICAL IMPRESSION(S) / ED DIAGNOSES   Final diagnoses:  Body aches   Rx / DC Orders   ED Discharge Orders          Ordered    ondansetron (ZOFRAN-ODT) 4 MG disintegrating tablet  Every 8 hours PRN        03/10/24 1928             Note:  This document was prepared using Dragon voice recognition software and may include unintentional dictation errors.    Margrette, Yordi Krager A, PA-C 03/10/24 2235    Clarine Ozell LABOR, MD 03/10/24 [phone]
